# Patient Record
Sex: Female | Born: 1990 | Race: White | Hispanic: No | Marital: Married | Smoking: Current every day smoker
Health system: Southern US, Community
[De-identification: ages and names within clinical notes are randomized; demographics above are authoritative.]

## PROBLEM LIST (undated history)

## (undated) HISTORY — PX: TUBAL LIGATION: SHX77

---
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
## 2012-04-24 ENCOUNTER — Encounter (HOSPITAL_COMMUNITY): Payer: Self-pay | Admitting: Emergency Medicine

## 2012-04-24 ENCOUNTER — Emergency Department (HOSPITAL_COMMUNITY)
Admission: EM | Admit: 2012-04-24 | Discharge: 2012-04-24 | Disposition: A | Discharge status: Home / Self Care | Payer: Medicaid Other | Attending: Emergency Medicine | Admitting: Emergency Medicine

## 2012-04-24 DIAGNOSIS — Z3202 Encounter for pregnancy test, result negative: Secondary | ICD-10-CM | POA: Insufficient documentation

## 2012-04-24 DIAGNOSIS — R109 Unspecified abdominal pain: Secondary | ICD-10-CM | POA: Insufficient documentation

## 2012-04-24 DIAGNOSIS — R110 Nausea: Secondary | ICD-10-CM | POA: Insufficient documentation

## 2012-04-24 DIAGNOSIS — R102 Pelvic and perineal pain: Secondary | ICD-10-CM

## 2012-04-24 LAB — URINALYSIS, ROUTINE W REFLEX MICROSCOPIC
Bilirubin Urine: NEGATIVE
Ketones, ur: NEGATIVE mg/dL
Leukocytes, UA: NEGATIVE
Specific Gravity, Urine: 1.02 (ref 1.005–1.030)
Urobilinogen, UA: 0.2 mg/dL (ref 0.0–1.0)
pH: 7 (ref 5.0–8.0)

## 2012-04-24 LAB — WET PREP, GENITAL
Clue Cells Wet Prep HPF POC: NONE SEEN
Yeast Wet Prep HPF POC: NONE SEEN

## 2012-04-24 LAB — PREGNANCY, URINE: Preg Test, Ur: NEGATIVE

## 2012-04-24 MED ORDER — ONDANSETRON HCL 4 MG PO TABS: 4.0000 mg | ORAL_TABLET | Freq: Three times a day (TID) | ORAL | Status: DC | PRN

## 2012-04-24 MED ORDER — KETOROLAC TROMETHAMINE 60 MG/2ML IM SOLN
60.0000 mg | Freq: Once | INTRAMUSCULAR | Status: DC
  Filled 2012-04-24: qty 2

## 2012-04-24 MED ORDER — ACETAMINOPHEN 500 MG PO TABS
1000.0000 mg | ORAL_TABLET | Freq: Once | ORAL | Status: AC
  Administered 2012-04-24: 1000 mg via ORAL
  Filled 2012-04-24: qty 2

## 2012-04-24 MED ORDER — IBUPROFEN 400 MG PO TABS
400.0000 mg | ORAL_TABLET | Freq: Once | ORAL | Status: AC
  Administered 2012-04-24: 400 mg via ORAL
  Filled 2012-04-24: qty 1

## 2012-04-24 MED ORDER — NAPROXEN 250 MG PO TABS: 250.0000 mg | ORAL_TABLET | Freq: Two times a day (BID) | ORAL | Status: DC

## 2012-04-24 NOTE — ED Notes (Signed)
Patient states she started having lower abdominal pain about 2 weeks ago that is accompanied by nausea, states she is really late for her period as well.  States that she is sexually active and could be pregnant.

## 2012-04-24 NOTE — ED Notes (Signed)
Patient complaining of abdominal pain and nausea x 2 weeks. States she is "really late for my period and I'm afraid I may be pregnant."

## 2012-04-24 NOTE — ED Provider Notes (Signed)
History     CSN: [id number]  Arrival date & time 04/24/12  2009   First MD Initiated Contact with Patient 04/24/12 2016      Chief Complaint  Patient presents with  . Abdominal Pain    HPI Pt was seen at 2030.   Per pt, c/o gradual onset and persistence of constant lower pelvic "pain" for the past 2 weeks.  Has been associated with nausea.  States she took a home preg test which was negative but "I'm late for my period and scared I'm pregnant."  LMP 03/11/2012 and "just spotted a little" in the last week.  Denies back pain, no vomiting/diarrhea, no fevers, no dysuria, no vaginal discharge, no CP/SOB.       History reviewed. No pertinent past medical history.  Past Surgical History  Procedure Date  . Cesarean section     History  Substance Use Topics  . Smoking status: Never Smoker   . Smokeless tobacco: Not on file  . Alcohol Use: No      Review of Systems ROS: Statement: All systems negative except as marked or noted in the HPI; Constitutional: Negative for fever and chills. ; ; Eyes: Negative for eye pain, redness and discharge. ; ; ENMT: Negative for ear pain, hoarseness, nasal congestion, sinus pressure and sore throat. ; ; Cardiovascular: Negative for chest pain, palpitations, diaphoresis, dyspnea and peripheral edema. ; ; Respiratory: Negative for cough, wheezing and stridor. ; ; Gastrointestinal: +nausea, abd pain. Negative for vomiting, diarrhea, blood in stool, hematemesis, jaundice and rectal bleeding. . ; ; Genitourinary: Negative for dysuria, flank pain and hematuria. ; ; GYN:  +vaginal spotting, no vaginal discharge, no vulvar pain. ;;Musculoskeletal: Negative for back pain and neck pain. Negative for swelling and trauma.; ; Skin: Negative for pruritus, rash, abrasions, blisters, bruising and skin lesion.; ; Neuro: Negative for headache, lightheadedness and neck stiffness. Negative for weakness, altered level of consciousness , altered mental status, extremity  weakness, paresthesias, involuntary movement, seizure and syncope.      Allergies  Diphtheria-tetanus toxoids dt  Home Medications  No current outpatient prescriptions on file.  BP 129/76  Pulse 104  Temp 98.9 F (37.2 C) (Oral)  Resp 16  Ht 4\' 10"  (1.473 m)  Wt 185 lb (83.915 kg)  BMI 38.66 kg/m2  SpO2 99%  LMP 03/11/2012  Physical Exam 2035: Physical examination:  Nursing notes reviewed; Vital signs and O2 SAT reviewed;  Constitutional: Well developed, Well nourished, Well hydrated, In no acute distress; Head:  Normocephalic, atraumatic; Eyes: EOMI, PERRL, No scleral icterus; ENMT: Mouth and pharynx normal, Mucous membranes moist; Neck: Supple, Full range of motion, No lymphadenopathy; Cardiovascular: Regular rate and rhythm, No murmur, rub, or gallop; Respiratory: Breath sounds clear & equal bilaterally, No rales, rhonchi, wheezes.  Speaking full sentences with ease, Normal respiratory effort/excursion; Chest: Nontender, Movement normal; Abdomen: Soft, +mild RLQ, suprapubic, LLQ tenderness to palp. No rebound or guarding. Nondistended, Normal bowel sounds; Genitourinary: No CVA tenderness; Pelvic exam performed with permission of pt and female ED tech assist during exam.  External genitalia w/o lesions. Vaginal vault with thick white discharge.  Cervix w/o lesions, not friable, GC/chlam and wet prep obtained and sent to lab.  Bimanual exam w/o CMT, uterine or adnexal tenderness.;;Extremities: Pulses normal, No tenderness, No edema, No calf edema or asymmetry.; Neuro: AA&Ox3, Major CN grossly intact.  Speech clear. Gait steady.  Climbs on and off stretcher easily by herself without distress.  No gross focal motor or sensory deficits  in extremities.; Skin: Color normal, Warm, Dry.   ED Course  Procedures    MDM  MDM Reviewed: nursing note, previous chart and vitals Interpretation: labs     Results for orders placed during the hospital encounter of 04/24/12  URINALYSIS, ROUTINE  W REFLEX MICROSCOPIC      Component Value Range   Color, Urine YELLOW  YELLOW   APPearance CLEAR  CLEAR   Specific Gravity, Urine 1.020  1.005 - 1.030   pH 7.0  5.0 - 8.0   Glucose, UA NEGATIVE  NEGATIVE mg/dL   Hgb urine dipstick NEGATIVE  NEGATIVE   Bilirubin Urine NEGATIVE  NEGATIVE   Ketones, ur NEGATIVE  NEGATIVE mg/dL   Protein, ur NEGATIVE  NEGATIVE mg/dL   Urobilinogen, UA 0.2  0.0 - 1.0 mg/dL   Nitrite NEGATIVE  NEGATIVE   Leukocytes, UA NEGATIVE  NEGATIVE  PREGNANCY, URINE      Component Value Range   Preg Test, Ur NEGATIVE  NEGATIVE  WET PREP, GENITAL      Component Value Range   Yeast Wet Prep HPF POC NONE SEEN  NONE SEEN   Trich, Wet Prep NONE SEEN  NONE SEEN   Clue Cells Wet Prep HPF POC NONE SEEN  NONE SEEN   WBC, Wet Prep HPF POC MANY (*) NONE SEEN     2215:  No pelvic tenderness on exam.  GC/chlam pending.  Continues to appear NAD, resps easy, gait upright and steady.  Climbs on and off stretcher easily by herself without distress.  Wants to go home now.  Dx and testing d/w pt.  Questions answered.  Verb understanding, agreeable to d/c home with outpt f/u.          Laray Anger, DO 04/28/12 [phone]

## 2014-06-11 ENCOUNTER — Encounter (HOSPITAL_COMMUNITY): Payer: Self-pay | Admitting: Emergency Medicine

## 2014-06-11 ENCOUNTER — Emergency Department (HOSPITAL_COMMUNITY)
Admission: EM | Admit: 2014-06-11 | Discharge: 2014-06-11 | Disposition: A | Discharge status: Home / Self Care | Payer: Medicaid Other | Attending: Emergency Medicine | Admitting: Emergency Medicine

## 2014-06-11 DIAGNOSIS — K0889 Other specified disorders of teeth and supporting structures: Secondary | ICD-10-CM

## 2014-06-11 DIAGNOSIS — K029 Dental caries, unspecified: Secondary | ICD-10-CM | POA: Insufficient documentation

## 2014-06-11 DIAGNOSIS — K088 Other specified disorders of teeth and supporting structures: Secondary | ICD-10-CM | POA: Insufficient documentation

## 2014-06-11 DIAGNOSIS — Z791 Long term (current) use of non-steroidal anti-inflammatories (NSAID): Secondary | ICD-10-CM | POA: Insufficient documentation

## 2014-06-11 MED ORDER — TRAMADOL HCL 50 MG PO TABS: 50.0000 mg | ORAL_TABLET | Freq: Four times a day (QID) | ORAL | Status: DC | PRN

## 2014-06-11 MED ORDER — PENICILLIN V POTASSIUM 250 MG PO TABS: 250.0000 mg | ORAL_TABLET | Freq: Four times a day (QID) | ORAL | Status: AC

## 2014-06-11 NOTE — Discharge Instructions (Signed)
Follow up with the dentist as discussed °Dental Pain °A tooth ache may be caused by cavities (tooth decay). Cavities expose the nerve of the tooth to air and hot or cold temperatures. It may come from an infection or abscess (also called a boil or furuncle) around your tooth. It is also often caused by dental caries (tooth decay). This causes the pain you are having. °DIAGNOSIS  °Your caregiver can diagnose this problem by exam. °TREATMENT  °· If caused by an infection, it may be treated with medications which kill germs (antibiotics) and pain medications as prescribed by your caregiver. Take medications as directed. °· Only take over-the-counter or prescription medicines for pain, discomfort, or fever as directed by your caregiver. °· Whether the tooth ache today is caused by infection or dental disease, you should see your dentist as soon as possible for further care. °SEEK MEDICAL CARE IF: °The exam and treatment you received today has been provided on an emergency basis only. This is not a substitute for complete medical or dental care. If your problem worsens or new problems (symptoms) appear, and you are unable to meet with your dentist, call or return to this location. °SEEK IMMEDIATE MEDICAL CARE IF:  °· You have a fever. °· You develop redness and swelling of your face, jaw, or neck. °· You are unable to open your mouth. °· You have severe pain uncontrolled by pain medicine. °MAKE SURE YOU:  °· Understand these instructions. °· Will watch your condition. °· Will get help right away if you are not doing well or get worse. °Document Released: 05/16/2005 Document Revised: 08/08/2011 Document Reviewed: 01/02/2008 °[employer]® Patient Information ©2015 [employer]. This information is not intended to replace advice given to you by your health care provider. Make sure you discuss any questions you have with your health care provider. ° °

## 2014-06-11 NOTE — ED Provider Notes (Signed)
CSN: [id number]     Arrival date & time 06/11/14  1353 History   First MD Initiated Contact with Patient 06/11/14 1412     Chief Complaint  Patient presents with  . Dental Pain     (Consider location/radiation/quality/duration/timing/severity/associated sxs/prior Treatment) Patient is a 24 y.o. female presenting with tooth pain. The history is provided by the patient. No language interpreter was used.  Dental Pain Location:  Generalized Quality:  Aching Severity:  Moderate Onset quality:  Gradual Duration:  4 weeks Timing:  Constant Progression:  Unchanged Chronicity:  New Relieved by:  Nothing Ineffective treatments:  Acetaminophen and topical anesthetic gel Associated symptoms: no fever, no gum swelling, no neck pain, no oral lesions and no trismus     History reviewed. No pertinent past medical history. Past Surgical History  Procedure Laterality Date  . Cesarean section     History reviewed. No pertinent family history. History  Substance Use Topics  . Smoking status: Never Smoker   . Smokeless tobacco: Not on file  . Alcohol Use: No   OB History    No data available     Review of Systems  Constitutional: Negative for fever.  HENT: Negative for mouth sores.   Musculoskeletal: Negative for neck pain.  All other systems reviewed and are negative.     Allergies  Diphtheria-tetanus toxoids dt and Latex  Home Medications   Prior to Admission medications   Medication Sig Start Date End Date Taking? Authorizing Provider  naproxen (NAPROSYN) 250 MG tablet Take 1 tablet (250 mg total) by mouth 2 (two) times daily with a meal. 04/24/12   Samuel JesterKathleen McManus, DO  ondansetron (ZOFRAN) 4 MG tablet Take 1 tablet (4 mg total) by mouth every 8 (eight) hours as needed for nausea. 04/24/12   Samuel JesterKathleen McManus, DO   BP 121/68 mmHg  Pulse 87  Temp(Src) 98.5 F (36.9 C) (Oral)  Resp 20  Ht 4\' 9"  (1.448 m)  Wt 160 lb (72.576 kg)  BMI 34.61 kg/m2  SpO2 100% Physical Exam   Constitutional: She appears well-developed and well-nourished.  HENT:  Right Ear: External ear normal.  Left Ear: External ear normal.  Pt has decay and and cracks to multiple teeth. No swelling noted  Cardiovascular: Normal rate and regular rhythm.   Pulmonary/Chest: Effort normal and breath sounds normal.  Nursing note and vitals reviewed.   ED Course  Procedures (including critical care time) Labs Review Labs Reviewed - No data to display  Imaging Review No results found.   EKG Interpretation None      MDM   Final diagnoses:  Pain, dental    Pt treated with pcn and ultram. No airway involvement    Teressa LowerVrinda Ezmeralda Stefanick, NP 06/11/14 1441  Joya Gaskinsonald W Wickline, MD 06/11/14 1549

## 2014-06-11 NOTE — ED Notes (Signed)
Pt states she has been having dental pain to both sides of mouth (upper and lower on L. Side and lower on R side) for about 4 weeks. States she cannot afford to see a Education officer, communitydentist.

## 2018-02-10 ENCOUNTER — Encounter (HOSPITAL_COMMUNITY): Payer: Self-pay | Admitting: Emergency Medicine

## 2018-02-10 ENCOUNTER — Other Ambulatory Visit: Payer: Self-pay

## 2018-02-10 ENCOUNTER — Inpatient Hospital Stay (HOSPITAL_COMMUNITY)
Admission: EM | Admit: 2018-02-10 | Discharge: 2018-02-14 | DRG: 419 | Disposition: A | Discharge status: Home / Self Care | Payer: Medicaid Other | Attending: Orthopedic Surgery | Admitting: Orthopedic Surgery

## 2018-02-10 ENCOUNTER — Emergency Department (HOSPITAL_COMMUNITY): Payer: Medicaid Other

## 2018-02-10 DIAGNOSIS — K8050 Calculus of bile duct without cholangitis or cholecystitis without obstruction: Secondary | ICD-10-CM

## 2018-02-10 DIAGNOSIS — Z6832 Body mass index (BMI) 32.0-32.9, adult: Secondary | ICD-10-CM

## 2018-02-10 DIAGNOSIS — E669 Obesity, unspecified: Secondary | ICD-10-CM | POA: Diagnosis present

## 2018-02-10 DIAGNOSIS — K8066 Calculus of gallbladder and bile duct with acute and chronic cholecystitis without obstruction: Principal | ICD-10-CM | POA: Diagnosis present

## 2018-02-10 DIAGNOSIS — F17210 Nicotine dependence, cigarettes, uncomplicated: Secondary | ICD-10-CM | POA: Diagnosis present

## 2018-02-10 DIAGNOSIS — K819 Cholecystitis, unspecified: Secondary | ICD-10-CM

## 2018-02-10 DIAGNOSIS — K810 Acute cholecystitis: Secondary | ICD-10-CM | POA: Diagnosis not present

## 2018-02-10 DIAGNOSIS — K2990 Gastroduodenitis, unspecified, without bleeding: Secondary | ICD-10-CM | POA: Diagnosis present

## 2018-02-10 LAB — URINALYSIS, ROUTINE W REFLEX MICROSCOPIC
Bilirubin Urine: NEGATIVE
Glucose, UA: NEGATIVE mg/dL
Ketones, ur: NEGATIVE mg/dL
Leukocytes, UA: NEGATIVE
Nitrite: NEGATIVE
Protein, ur: 30 mg/dL — AB
Specific Gravity, Urine: 1.039 — ABNORMAL HIGH (ref 1.005–1.030)
pH: 5 (ref 5.0–8.0)

## 2018-02-10 LAB — COMPREHENSIVE METABOLIC PANEL
ALT: 14 U/L (ref 0–44)
AST: 21 U/L (ref 15–41)
Albumin: 4.3 g/dL (ref 3.5–5.0)
Alkaline Phosphatase: 64 U/L (ref 38–126)
Anion gap: 8 (ref 5–15)
BUN: 12 mg/dL (ref 6–20)
CO2: 25 mmol/L (ref 22–32)
Calcium: 9.1 mg/dL (ref 8.9–10.3)
Chloride: 110 mmol/L (ref 98–111)
Creatinine, Ser: 0.79 mg/dL (ref 0.44–1.00)
GFR calc Af Amer: >60 mL/min (ref >60)
GFR calc non Af Amer: >60 mL/min (ref >60)
Glucose, Bld: 116 mg/dL — ABNORMAL HIGH (ref 70–99)
Potassium: 4.1 mmol/L (ref 3.5–5.1)
Sodium: 143 mmol/L (ref 135–145)
Total Bilirubin: 0.5 mg/dL (ref 0.3–1.2)
Total Protein: 7.9 g/dL (ref 6.5–8.1)

## 2018-02-10 LAB — CBC
HCT: 40.7 % (ref 36.0–46.0)
Hemoglobin: 12.8 g/dL (ref 12.0–15.0)
MCH: 28.4 pg (ref 26.0–34.0)
MCHC: 31.4 g/dL (ref 30.0–36.0)
MCV: 90.2 fL (ref 78.0–100.0)
Platelets: 223 10*3/uL (ref 150–400)
RBC: 4.51 MIL/uL (ref 3.87–5.11)
RDW: 13.8 % (ref 11.5–15.5)
WBC: 6.7 10*3/uL (ref 4.0–10.5)

## 2018-02-10 LAB — I-STAT BETA HCG BLOOD, ED (MC, WL, AP ONLY): I-stat hCG, quantitative: <5 m[IU]/mL (ref <5)

## 2018-02-10 LAB — LIPASE, BLOOD: Lipase: 27 U/L (ref 11–51)

## 2018-02-10 MED ORDER — ONDANSETRON HCL 4 MG PO TABS: 4.0000 mg | ORAL_TABLET | Freq: Four times a day (QID) | ORAL | Status: DC | PRN

## 2018-02-10 MED ORDER — ACETAMINOPHEN 650 MG RE SUPP: 650.0000 mg | Freq: Four times a day (QID) | RECTAL | Status: DC | PRN

## 2018-02-10 MED ORDER — ONDANSETRON HCL 4 MG/2ML IJ SOLN
4.0000 mg | Freq: Four times a day (QID) | INTRAMUSCULAR | Status: DC | PRN
  Administered 2018-02-11 – 2018-02-12 (×3): 4 mg via INTRAVENOUS
  Filled 2018-02-10 (×3): qty 2

## 2018-02-10 MED ORDER — IOPAMIDOL (ISOVUE-300) INJECTION 61%
100.0000 mL | Freq: Once | INTRAVENOUS | Status: AC | PRN
  Administered 2018-02-10: 100 mL via INTRAVENOUS

## 2018-02-10 MED ORDER — GI COCKTAIL ~~LOC~~
30.0000 mL | Freq: Once | ORAL | Status: AC
  Administered 2018-02-10: 30 mL via ORAL
  Filled 2018-02-10: qty 30

## 2018-02-10 MED ORDER — SENNOSIDES-DOCUSATE SODIUM 8.6-50 MG PO TABS: 1.0000 | ORAL_TABLET | Freq: Every evening | ORAL | Status: DC | PRN

## 2018-02-10 MED ORDER — NICOTINE 21 MG/24HR TD PT24
21.0000 mg | MEDICATED_PATCH | Freq: Every day | TRANSDERMAL | Status: DC
  Administered 2018-02-10 – 2018-02-14 (×4): 21 mg via TRANSDERMAL
  Filled 2018-02-10 (×5): qty 1

## 2018-02-10 MED ORDER — HYDROMORPHONE HCL 1 MG/ML IJ SOLN
1.0000 mg | INTRAMUSCULAR | Status: DC | PRN
  Administered 2018-02-10 – 2018-02-11 (×7): 1 mg via INTRAVENOUS
  Filled 2018-02-10 (×7): qty 1

## 2018-02-10 MED ORDER — SODIUM CHLORIDE 0.9 % IV BOLUS
1000.0000 mL | Freq: Once | INTRAVENOUS | Status: AC
  Administered 2018-02-10: 1000 mL via INTRAVENOUS

## 2018-02-10 MED ORDER — ZOLPIDEM TARTRATE 5 MG PO TABS
5.0000 mg | ORAL_TABLET | Freq: Every evening | ORAL | Status: DC | PRN
  Administered 2018-02-12 (×2): 5 mg via ORAL
  Filled 2018-02-10 (×2): qty 1

## 2018-02-10 MED ORDER — POTASSIUM CHLORIDE IN NACL 20-0.9 MEQ/L-% IV SOLN
INTRAVENOUS | Status: DC
  Administered 2018-02-10 – 2018-02-14 (×8): via INTRAVENOUS

## 2018-02-10 MED ORDER — MORPHINE SULFATE (PF) 4 MG/ML IV SOLN
6.0000 mg | Freq: Once | INTRAVENOUS | Status: AC
  Administered 2018-02-10: 6 mg via INTRAVENOUS
  Filled 2018-02-10: qty 2

## 2018-02-10 MED ORDER — ACETAMINOPHEN 325 MG PO TABS
650.0000 mg | ORAL_TABLET | Freq: Four times a day (QID) | ORAL | Status: DC | PRN
  Administered 2018-02-11: 650 mg via ORAL
  Filled 2018-02-10: qty 2

## 2018-02-10 MED ORDER — ONDANSETRON HCL 4 MG/2ML IJ SOLN
4.0000 mg | Freq: Once | INTRAMUSCULAR | Status: AC
  Administered 2018-02-10: 4 mg via INTRAVENOUS
  Filled 2018-02-10: qty 2

## 2018-02-10 MED ORDER — SODIUM CHLORIDE 0.9 % IV SOLN
2.0000 g | INTRAVENOUS | Status: DC
  Administered 2018-02-10 – 2018-02-13 (×4): 2 g via INTRAVENOUS
  Filled 2018-02-10 (×3): qty 20
  Filled 2018-02-10: qty 2
  Filled 2018-02-10 (×4): qty 20

## 2018-02-10 MED ORDER — FAMOTIDINE IN NACL 20-0.9 MG/50ML-% IV SOLN
20.0000 mg | Freq: Once | INTRAVENOUS | Status: AC
  Administered 2018-02-10: 20 mg via INTRAVENOUS
  Filled 2018-02-10: qty 50

## 2018-02-10 MED ORDER — HYDROMORPHONE HCL 1 MG/ML IJ SOLN
1.0000 mg | Freq: Once | INTRAMUSCULAR | Status: AC
  Administered 2018-02-10: 1 mg via INTRAVENOUS
  Filled 2018-02-10: qty 1

## 2018-02-10 NOTE — Progress Notes (Signed)
Called to room by Lonn Georgiaachel Tate RN she she states that patient admits chest discomfort but denies chest pain. Vitals taken and are as follows: BH 110/52 HR53 O2 SAT 99 on RA. Patient appears asymptomatic for cardiac distress. Mid level paged with above finding. RN's will continue to monitor PT as shift continues. Before conclusion of this note midlevel did respond with order to do an EXG

## 2018-02-10 NOTE — ED Triage Notes (Signed)
Patient c/o upper abd pain that started x1 week ago and is progressively getting worse. Per patient nausea and vomiting. Denies any diarrhea or urinary symptoms. Patient does state pain radiates into back.

## 2018-02-10 NOTE — H&P (Signed)
History and Physical  Michaela [name] NFA:[id number]:[id number] DOB: [birth year] DOA: 02/10/2018  Referring physician: Dr Juleen ChinaKohut, ED physician PCP: Patient, No Pcp Per  Outpatient Specialists:   Patient Coming From: home  Chief Complaint: abdominal pain  HPI: Michaela [name] is a 27 y.o. female who presents with 5 days of progressively worse abdominal pain in epigastric region with radiation into back. Pain initially intermittent, but now more constant x24 hours. Tylenol and NSAIDs have not been helpful. No other palliating or provoking factors.   Emergency Department Course: CT scan shows possible cholecystitis. WBC normal. EDP discussed with Dr Lovell SheehanJenkins (Gen Surg), who recommended antibiotics and admission - he will see her tomorrow as she is not acutely in need of surgery.   Review of Systems:  Pt denies any fevers, chills, nausea, vomiting, diarrhea, constipation, shortness of breath, dyspnea on exertion, orthopnea, cough, wheezing, palpitations, headache, vision changes, lightheadedness, dizziness, melena, rectal bleeding.  Review of systems are otherwise negative  History reviewed. No pertinent past medical history. Past Surgical History:  Procedure Laterality Date  . CESAREAN SECTION    . TUBAL LIGATION     Social History:  reports that she has been smoking cigarettes. She has a 2.00 pack-year smoking history. She has never used smokeless tobacco. She reports that she does not drink alcohol or use drugs. Patient lives at home  Allergies  Allergen Reactions  . Pertussis Vaccines Hives    Headache   . Latex Itching and Rash    No family history on file.    Prior to Admission medications   Not on File    Physical Exam: BP 117/79   Pulse 99   Temp 97.7 F (36.5 C) (Oral)   Resp 15   Ht 4\' 9"  (1.448 m)   Wt 68 kg   SpO2 99%   BMI 32.46 kg/m   . General: Young caucasian female. Awake and alert and oriented x3. No acute cardiopulmonary distress.  Marland Kitchen. HEENT: Normocephalic  atraumatic.  Right and left ears normal in appearance.  Pupils equal, round, reactive to light. Extraocular muscles are intact. Sclerae anicteric and noninjected.  Moist mucosal membranes. No mucosal lesions.  . Neck: Neck supple without lymphadenopathy. No carotid bruits. No masses palpated.  . Cardiovascular: Regular rate with normal S1-S2 sounds. No murmurs, rubs, gallops auscultated. No JVD.  Marland Kitchen. Respiratory: Good respiratory effort with no wheezes, rales, rhonchi. Lungs clear to auscultation bilaterally.  No accessory muscle use. . Abdomen: Soft, tender in the epigastric and RUQ area, no rebound tenderness, but significant guarding. Nondistended. Active bowel sounds. No masses or hepatosplenomegaly  . Skin: No rashes, lesions, or ulcerations.  Dry, warm to touch. 2+ dorsalis pedis and radial pulses. . Musculoskeletal: No calf or leg pain. All major joints not erythematous nontender.  No upper or lower joint deformation.  Good ROM.  No contractures  . Psychiatric: Intact judgment and insight. Pleasant and cooperative. . Neurologic: No focal neurological deficits. Strength is 5/5 and symmetric in upper and lower extremities.  Cranial nerves II through XII are grossly intact.           Labs on Admission: I have personally reviewed following labs and imaging studies  CBC: Recent Labs  Lab 02/10/18 1204  WBC 6.7  HGB 12.8  HCT 40.7  MCV 90.2  PLT 223   Basic Metabolic Panel: Recent Labs  Lab 02/10/18 1204  NA 143  K 4.1  CL 110  CO2 25  GLUCOSE 116*  BUN 12  CREATININE 0.79  CALCIUM 9.1   GFR: Estimated Creatinine Clearance: 85.5 mL/min (by C-G formula based on SCr of 0.79 mg/dL). Liver Function Tests: Recent Labs  Lab 02/10/18 1204  AST 21  ALT 14  ALKPHOS 64  BILITOT 0.5  PROT 7.9  ALBUMIN 4.3   Recent Labs  Lab 02/10/18 1204  LIPASE 27   No results for input(s): AMMONIA in the last 168 hours. Coagulation Profile: No results for input(s): INR, PROTIME in the  last 168 hours. Cardiac Enzymes: No results for input(s): CKTOTAL, CKMB, CKMBINDEX, TROPONINI in the last 168 hours. BNP (last 3 results) No results for input(s): PROBNP in the last 8760 hours. HbA1C: No results for input(s): HGBA1C in the last 72 hours. CBG: No results for input(s): GLUCAP in the last 168 hours. Lipid Profile: No results for input(s): CHOL, HDL, LDLCALC, TRIG, CHOLHDL, LDLDIRECT in the last 72 hours. Thyroid Function Tests: No results for input(s): TSH, T4TOTAL, FREET4, T3FREE, THYROIDAB in the last 72 hours. Anemia Panel: No results for input(s): VITAMINB12, FOLATE, FERRITIN, TIBC, IRON, RETICCTPCT in the last 72 hours. Urine analysis:    Component Value Date/Time   COLORURINE YELLOW 02/10/2018 1204   APPEARANCEUR HAZY (A) 02/10/2018 1204   LABSPEC 1.039 (H) 02/10/2018 1204   PHURINE 5.0 02/10/2018 1204   GLUCOSEU NEGATIVE 02/10/2018 1204   HGBUR MODERATE (A) 02/10/2018 1204   BILIRUBINUR NEGATIVE 02/10/2018 1204   KETONESUR NEGATIVE 02/10/2018 1204   PROTEINUR 30 (A) 02/10/2018 1204   UROBILINOGEN 0.2 04/24/2012 2027   NITRITE NEGATIVE 02/10/2018 1204   LEUKOCYTESUR NEGATIVE 02/10/2018 1204   Sepsis Labs: @LABRCNTIP (procalcitonin:4,lacticidven:4) )No results found for this or any previous visit (from the past 240 hour(s)).   Radiological Exams on Admission: Ct Abdomen Pelvis W Contrast  Result Date: 02/10/2018 CLINICAL DATA:  Acute upper abdominal pain EXAM: CT ABDOMEN AND PELVIS WITH CONTRAST TECHNIQUE: Multidetector CT imaging of the abdomen and pelvis was performed using the standard protocol following bolus administration of intravenous contrast. CONTRAST:  ISOVUE-300 IOPAMIDOL (ISOVUE-300) INJECTION 61% COMPARISON:  None. FINDINGS: Lower chest: No acute abnormality. Hepatobiliary: Gallbladder is well distended. Some mild suggestion of gallbladder wall thickening and pericholecystic fluid is noted. The liver demonstrates some periportal hypodensity  which may be related to the timing of the contrast bolus although the possibility of an underlying hepatic inflammatory change could not be totally excluded. Pancreas: Unremarkable. No pancreatic ductal dilatation or surrounding inflammatory changes. Spleen: Patchy enhancement changes are noted within the spleen likely related to the timing of the contrast bolus. Adrenals/Urinary Tract: Adrenal glands are within normal limits. The kidneys are well visualized bilaterally. No renal calculi or obstructive changes are noted. Bladder is decompressed. Stomach/Bowel: Colon is within normal limits. No small bowel obstructive changes are seen. The appendix is within normal limits. Vascular/Lymphatic: No significant vascular findings are present. No enlarged abdominal or pelvic lymph nodes. Reproductive: Uterus and bilateral adnexa are unremarkable. Changes of prior tubal ligation are noted. Other: No abdominal wall hernia or abnormality. No abdominopelvic ascites. Musculoskeletal: No acute or significant osseous findings. IMPRESSION: Periportal edematous changes which may be related to the timing of the contrast bolus as no significant hepatic venous opacification is noted although the possibility of an underlying hepatic inflammatory change could not be totally excluded. Correlate with hepatic lab values Mild inflammatory changes surrounding the gallbladder. The possibility of cholecystitis deserves consideration but incompletely evaluated on this CT. Right upper quadrant ultrasound may be helpful. Electronically Signed   By: Eulah Pont.D.  On: 02/10/2018 14:40    Assessment/Plan: Active Problems:   Cholecystitis, acute    This patient was discussed with the ED physician, including pertinent vitals, physical exam findings, labs, and imaging.  We also discussed care given by the ED provider.  1. Cholecystitis a. Observation b. Pain control c. Ceftriaxone d. [place] tomorrow AM e. Gen Surg to see f. Repeat  CBC, BMP g. IVF h. Clear liquid diet  DVT prophylaxis: SCD Consultants: Gen Surg Code Status: Full Family Communication: Mother present during interview and exam  Disposition Plan: patient to return home following improvement of abdominal pain   Levie Heritage, DO [facility] Pager [phone]  If 7PM-7AM, please contact night-coverage [email or web address] Password [email or web address]

## 2018-02-10 NOTE — ED Provider Notes (Signed)
[facility] EMERGENCY DEPARTMENT Provider Note   CSN: [id number] Arrival date & time: 02/10/18  1154     History   Chief Complaint Chief Complaint  Patient presents with  . Abdominal Pain    HPI Michaela [name] is a 27 y.o. female.  HPI   27 year old female with abdominal pain.  Onset about 5 days ago and progressively worsening.  Initially the pain was intermittent, but is been pretty constant for the last 24 hours or so.  The pain is in epigastric to periumbilical region with radiation into the back.  No clear postprandial changes.  Associate with nausea and has vomited on occasion.  No diarrhea.  No urinary symptoms.  She is tried taking Tylenol and NSAIDs without improvement.  She does not use NSAIDs on a regular basis otherwise.  Reports only occasional alcohol use.  No fevers or chills.  Surgical history significant for cesarean section and tubal ligation.  History reviewed. No pertinent past medical history.  There are no active problems to display for this patient.   Past Surgical History:  Procedure Laterality Date  . CESAREAN SECTION    . TUBAL LIGATION       OB History    Gravida  2   Para  2   Term  2   Preterm      AB      Living  2     SAB      TAB      Ectopic      Multiple      Live Births               Home Medications    Prior to Admission medications   Medication Sig Start Date End Date Taking? Authorizing Provider  acetaminophen (TYLENOL) 500 MG tablet Take 1,000 mg by mouth every 8 (eight) hours as needed (pain).    [provider]  ibuprofen (ADVIL,MOTRIN) 200 MG tablet Take 600 mg by mouth every 8 (eight) hours as needed (pain).    [provider]  naproxen (NAPROSYN) 250 MG tablet Take 1 tablet (250 mg total) by mouth 2 (two) times daily with a meal. Patient not taking: Reported on 06/11/2014 04/24/12   Samuel Jester, DO  ondansetron (ZOFRAN) 4 MG tablet Take 1 tablet (4 mg total) by mouth every 8  (eight) hours as needed for nausea. Patient not taking: Reported on 06/11/2014 04/24/12   Samuel Jester, DO  traMADol (ULTRAM) 50 MG tablet Take 1 tablet (50 mg total) by mouth every 6 (six) hours as needed. 06/11/14   Teressa Lower, NP    Family History No family history on file.  Social History Social History   Tobacco Use  . Smoking status: Current Every Day Smoker    Packs/day: 0.50    Years: 4.00    Pack years: 2.00    Types: Cigarettes  . Smokeless tobacco: Never Used  Substance Use Topics  . Alcohol use: No  . Drug use: No     Allergies   Pertussis vaccines and Latex   Review of Systems Review of Systems  All systems reviewed and negative, other than as noted in HPI.  Physical Exam Updated Vital Signs BP (!) 129/111 (BP Location: Right Arm)   Pulse (!) 55   Temp 97.7 F (36.5 C) (Oral)   Resp 15   Ht 4\' 9"  (1.448 m)   Wt 68 kg   SpO2 100%   BMI 32.46 kg/m   Physical Exam  Constitutional: She appears well-developed and well-nourished. No distress.  And on edge of bed.  Appears uncomfortable, but not toxic.  HENT:  Head: Normocephalic and atraumatic.  Eyes: Conjunctivae are normal. Right eye exhibits no discharge. Left eye exhibits no discharge.  Neck: Neck supple.  Cardiovascular: Normal rate, regular rhythm and normal heart sounds. Exam reveals no gallop and no friction rub.  No murmur heard. Pulmonary/Chest: Effort normal and breath sounds normal. No respiratory distress.  Abdominal: Soft. She exhibits no distension and no mass. There is tenderness. There is no guarding.  Tenderness across upper abdomen, focally worse in epigastrium. No rebound or guarding.   Musculoskeletal: She exhibits no edema or tenderness.  Neurological: She is alert.  Skin: Skin is warm and dry.  Psychiatric: She has a normal mood and affect. Her behavior is normal. Thought content normal.  Nursing note and vitals reviewed.    ED Treatments / Results  Labs (all  labs ordered are listed, but only abnormal results are displayed) Labs Reviewed  COMPREHENSIVE METABOLIC PANEL - Abnormal; Notable for the following components:      Result Value   Glucose, Bld 116 (*)    All other components within normal limits  URINALYSIS, ROUTINE W REFLEX MICROSCOPIC - Abnormal; Notable for the following components:   APPearance HAZY (*)    Specific Gravity, Urine 1.039 (*)    Hgb urine dipstick MODERATE (*)    Protein, ur 30 (*)    Bacteria, UA RARE (*)    All other components within normal limits  LIPASE, BLOOD  CBC  I-STAT BETA HCG BLOOD, ED ([facility], [facility], [facility] ONLY)    EKG None  Radiology Ct Abdomen Pelvis W Contrast  Result Date: 02/10/2018 CLINICAL DATA:  Acute upper abdominal pain EXAM: CT ABDOMEN AND PELVIS WITH CONTRAST TECHNIQUE: Multidetector CT imaging of the abdomen and pelvis was performed using the standard protocol following bolus administration of intravenous contrast. CONTRAST:  100mL ISOVUE-300 IOPAMIDOL (ISOVUE-300) INJECTION 61% COMPARISON:  None. FINDINGS: Lower chest: No acute abnormality. Hepatobiliary: Gallbladder is well distended. Some mild suggestion of gallbladder wall thickening and pericholecystic fluid is noted. The liver demonstrates some periportal hypodensity which may be related to the timing of the contrast bolus although the possibility of an underlying hepatic inflammatory change could not be totally excluded. Pancreas: Unremarkable. No pancreatic ductal dilatation or surrounding inflammatory changes. Spleen: Patchy enhancement changes are noted within the spleen likely related to the timing of the contrast bolus. Adrenals/Urinary Tract: Adrenal glands are within normal limits. The kidneys are well visualized bilaterally. No renal calculi or obstructive changes are noted. Bladder is decompressed. Stomach/Bowel: Colon is within normal limits. No small bowel obstructive changes are seen. The appendix is within normal limits.  Vascular/Lymphatic: No significant vascular findings are present. No enlarged abdominal or pelvic lymph nodes. Reproductive: Uterus and bilateral adnexa are unremarkable. Changes of prior tubal ligation are noted. Other: No abdominal wall hernia or abnormality. No abdominopelvic ascites. Musculoskeletal: No acute or significant osseous findings. IMPRESSION: Periportal edematous changes which may be related to the timing of the contrast bolus as no significant hepatic venous opacification is noted although the possibility of an underlying hepatic inflammatory change could not be totally excluded. Correlate with hepatic lab values Mild inflammatory changes surrounding the gallbladder. The possibility of cholecystitis deserves consideration but incompletely evaluated on this CT. Right upper quadrant ultrasound may be helpful. Electronically Signed   By: Alcide CleverMark  Lukens M.D.   On: 02/10/2018 14:40    Procedures Procedures (including  critical care time)  Medications Ordered in ED Medications  gi cocktail (Maalox,Lidocaine,Donnatal) (has no administration in time range)  famotidine (PEPCID) IVPB 20 mg premix (has no administration in time range)  HYDROmorphone (DILAUDID) injection 1 mg (has no administration in time range)  ondansetron (ZOFRAN) injection 4 mg (has no administration in time range)  sodium chloride 0.9 % bolus 1,000 mL (has no administration in time range)     Initial Impression / Assessment and Plan / ED Course  I have reviewed the triage vital signs and the nursing notes.  Pertinent labs & imaging results that were available during my care of the patient were reviewed by me and considered in my medical decision making (see chart for details).     27 year old female with upper abdominal pain.  My initial impression is that this is likely symptomatic cholelithiasis.  She seems fairly uncomfortable.  She is tender, but does not have peritonitis.  Will obtain labs including LFTs and  lipase.  Will obtain a right upper quadrant ultrasound.  Symptomatic treatment in the meantime.  Unable to obtain US for this indication on weekend. CT ordered and showing what is probably cholecystitis. She is afebrile. No leukocytosis. LFTs/lipase normal. She is still having significant pain though.  Discussed with Dr. Lovell Sheehan, general surgery.  Requesting hospitalist admission.  He is fine with a clear liquid diet.  He will see her in the morning.   Final Clinical Impressions(s) / ED Diagnoses   Final diagnoses:  Cholecystitis    ED Discharge Orders    None       Raeford Razor, MD 02/10/18 1524

## 2018-02-11 ENCOUNTER — Observation Stay (HOSPITAL_COMMUNITY): Payer: Medicaid Other

## 2018-02-11 DIAGNOSIS — K810 Acute cholecystitis: Secondary | ICD-10-CM | POA: Diagnosis not present

## 2018-02-11 LAB — BASIC METABOLIC PANEL
ANION GAP: 4 — AB (ref 5–15)
BUN: 5 mg/dL — ABNORMAL LOW (ref 6–20)
CHLORIDE: 110 mmol/L (ref 98–111)
CO2: 25 mmol/L (ref 22–32)
Calcium: 8.4 mg/dL — ABNORMAL LOW (ref 8.9–10.3)
Creatinine, Ser: 0.66 mg/dL (ref 0.44–1.00)
GFR calc non Af Amer: >60 mL/min (ref >60)
GLUCOSE: 110 mg/dL — AB (ref 70–99)
Potassium: 3.9 mmol/L (ref 3.5–5.1)
Sodium: 139 mmol/L (ref 135–145)

## 2018-02-11 LAB — CBC
HCT: 34.7 % — ABNORMAL LOW (ref 36.0–46.0)
HEMOGLOBIN: 10.8 g/dL — AB (ref 12.0–15.0)
MCH: 28.4 pg (ref 26.0–34.0)
MCHC: 31.1 g/dL (ref 30.0–36.0)
MCV: 91.3 fL (ref 78.0–100.0)
Platelets: 174 10*3/uL (ref 150–400)
RBC: 3.8 MIL/uL — AB (ref 3.87–5.11)
RDW: 14 % (ref 11.5–15.5)
WBC: 5.2 10*3/uL (ref 4.0–10.5)

## 2018-02-11 MED ORDER — ENOXAPARIN SODIUM 40 MG/0.4ML ~~LOC~~ SOLN
40.0000 mg | SUBCUTANEOUS | Status: DC
  Administered 2018-02-13: 40 mg via SUBCUTANEOUS
  Filled 2018-02-11 (×2): qty 0.4

## 2018-02-11 MED ORDER — HYDROMORPHONE HCL 1 MG/ML IJ SOLN
1.0000 mg | INTRAMUSCULAR | Status: DC | PRN
  Administered 2018-02-12 (×4): 1 mg via INTRAVENOUS
  Filled 2018-02-11 (×4): qty 1

## 2018-02-11 MED ORDER — HYDROMORPHONE HCL 1 MG/ML IJ SOLN
1.0000 mg | Freq: Once | INTRAMUSCULAR | Status: AC
  Administered 2018-02-11: 1 mg via INTRAVENOUS
  Filled 2018-02-11: qty 1

## 2018-02-11 MED ORDER — CHLORHEXIDINE GLUCONATE CLOTH 2 % EX PADS
6.0000 | MEDICATED_PAD | Freq: Once | CUTANEOUS | Status: AC
  Administered 2018-02-12: 6 via TOPICAL

## 2018-02-11 MED ORDER — CHLORHEXIDINE GLUCONATE CLOTH 2 % EX PADS
6.0000 | MEDICATED_PAD | Freq: Once | CUTANEOUS | Status: AC
  Administered 2018-02-11: 6 via TOPICAL

## 2018-02-11 NOTE — Consult Note (Signed)
Reason for Consult: Right upper quadrant abdominal pain Referring Physician: Dr. Edmonia Lynch is an 27 y.o. [name].  HPI: Patient is a Michaela [name] who presented to the emergency room with a 5-day history of worsening right upper quadrant abdominal pain.  Patient did have nausea but no vomiting.  She was found on CT scan of the abdomen to have pericholecystic edema.  Ultrasound of the right upper quadrant is pending.  Patient continues to have intermittent abdominal pain with an 8 out of 10.  No fever, chills, or jaundice.  History reviewed. No pertinent past medical history.  Past Surgical History:  Procedure Laterality Date  . CESAREAN SECTION    . TUBAL LIGATION      History reviewed. No pertinent family history.  Social History:  reports that she has been smoking cigarettes. She has a 2.00 pack-year smoking history. She has never used smokeless tobacco. She reports that she does not drink alcohol or use drugs.  Allergies:  Allergies  Allergen Reactions  . Pertussis Vaccines Hives    Headache   . Latex Itching and Rash    Medications: I have reviewed the patient's current medications.  Results for orders placed or performed during the hospital encounter of 02/10/18 (from the past 48 hour(s))  Lipase, blood     Status: None   Collection Time: 02/10/18 12:04 PM  Result Value Ref Range   Lipase 27 11 - 51 U/L    Comment: Performed at [facility], [address], [place], [place] [address]  Comprehensive metabolic panel     Status: Abnormal   Collection Time: 02/10/18 12:04 PM  Result Value Ref Range   Sodium 143 135 - 145 mmol/L   Potassium 4.1 3.5 - 5.1 mmol/L   Chloride 110 98 - 111 mmol/L   CO2 25 22 - 32 mmol/L   Glucose, Bld 116 (H) 70 - 99 mg/dL   BUN 12 6 - 20 mg/dL   Creatinine, Ser 0.79 0.44 - 1.00 mg/dL   Calcium 9.1 8.9 - 10.3 mg/dL   Total Protein 7.9 6.5 - 8.1 g/dL   Albumin 4.3 3.5 - 5.0 g/dL   AST 21 15 - 41 U/L   ALT 14 0 - 44  U/L   Alkaline Phosphatase 64 38 - 126 U/L   Total Bilirubin 0.5 0.3 - 1.2 mg/dL   GFR calc non Af Amer >60 >60 mL/min   GFR calc Af Amer >60 >60 mL/min    Comment: (NOTE) The eGFR has been calculated using the CKD EPI equation. This calculation has not been validated in all clinical situations. eGFR's persistently <60 mL/min signify possible Chronic Kidney Disease.    Anion gap 8 5 - 15    Comment: Performed at [facility], [address] [place], [place] [address]  CBC     Status: None   Collection Time: 02/10/18 12:04 PM  Result Value Ref Range   WBC 6.7 4.0 - 10.5 K/uL   RBC 4.51 3.87 - 5.11 MIL/uL   Hemoglobin 12.8 12.0 - 15.0 g/dL   HCT 40.7 36.0 - 46.0 %   MCV 90.2 78.0 - 100.0 fL   MCH 28.4 26.0 - 34.0 pg   MCHC 31.4 30.0 - 36.0 g/dL   RDW 13.8 11.5 - 15.5 %   Platelets 223 150 - 400 K/uL    Comment: Performed at [facility], [address], [place], [place] [address]  Urinalysis, Routine w reflex microscopic  Status: Abnormal   Collection Time: 02/10/18 12:04 PM  Result Value Ref Range   Color, Urine YELLOW YELLOW   APPearance HAZY (A) CLEAR   Specific Gravity, Urine 1.039 (H) 1.005 - 1.030   pH 5.0 5.0 - 8.0   Glucose, UA NEGATIVE NEGATIVE mg/dL   Hgb urine dipstick MODERATE (A) NEGATIVE   Bilirubin Urine NEGATIVE NEGATIVE   Ketones, ur NEGATIVE NEGATIVE mg/dL   Protein, ur 30 (A) NEGATIVE mg/dL   Nitrite NEGATIVE NEGATIVE   Leukocytes, UA NEGATIVE NEGATIVE   RBC / HPF 0-5 0 - 5 RBC/hpf   WBC, UA 0-5 0 - 5 WBC/hpf   Bacteria, UA RARE (A) NONE SEEN   Squamous Epithelial / LPF 6-10 0 - 5   Mucus PRESENT     Comment: Performed at [facility], [address], [place], [place] [address]  I-Stat beta hCG blood, ED     Status: None   Collection Time: 02/10/18 12:16 PM  Result Value Ref Range   I-stat hCG, quantitative <5.0 <5 mIU/mL   Comment 3            Comment:   GEST. AGE      CONC.  (mIU/mL)   <=1 WEEK        5 - 50     2 WEEKS       50 - 500     3  WEEKS       100 - 10,000     4 WEEKS     1,000 - 30,000        [name] AND NON-PREGNANT [name]:     LESS THAN 5 mIU/mL   Basic metabolic panel     Status: Abnormal   Collection Time: 02/11/18  6:57 AM  Result Value Ref Range   Sodium 139 135 - 145 mmol/L   Potassium 3.9 3.5 - 5.1 mmol/L   Chloride 110 98 - 111 mmol/L   CO2 25 22 - 32 mmol/L   Glucose, Bld 110 (H) 70 - 99 mg/dL   BUN 5 (L) 6 - 20 mg/dL   Creatinine, Ser 0.66 0.44 - 1.00 mg/dL   Calcium 8.4 (L) 8.9 - 10.3 mg/dL   GFR calc non Af Amer >60 >60 mL/min   GFR calc Af Amer >60 >60 mL/min    Comment: (NOTE) The eGFR has been calculated using the CKD EPI equation. This calculation has not been validated in all clinical situations. eGFR's persistently <60 mL/min signify possible Chronic Kidney Disease.    Anion gap 4 (L) 5 - 15    Comment: Performed at [facility], [address], [place], [place] [address]  CBC     Status: Abnormal   Collection Time: 02/11/18  6:57 AM  Result Value Ref Range   WBC 5.2 4.0 - 10.5 K/uL   RBC 3.80 (L) 3.87 - 5.11 MIL/uL   Hemoglobin 10.8 (L) 12.0 - 15.0 g/dL   HCT 34.7 (L) 36.0 - 46.0 %   MCV 91.3 78.0 - 100.0 fL   MCH 28.4 26.0 - 34.0 pg   MCHC 31.1 30.0 - 36.0 g/dL   RDW 14.0 11.5 - 15.5 %   Platelets 174 150 - 400 K/uL    Comment: Performed at [facility], [address] [place], [place] [address]    Ct Abdomen Pelvis W Contrast  Result Date: 02/10/2018 CLINICAL DATA:  Acute upper abdominal pain EXAM: CT ABDOMEN AND PELVIS WITH CONTRAST TECHNIQUE: Multidetector CT imaging of the abdomen and  pelvis was performed using the standard protocol following bolus administration of intravenous contrast. CONTRAST:  144m ISOVUE-300 IOPAMIDOL (ISOVUE-300) INJECTION 61% COMPARISON:  None. FINDINGS: Lower chest: No acute abnormality. Hepatobiliary: Gallbladder is well distended. Some mild suggestion of gallbladder wall thickening and pericholecystic fluid is noted. The liver demonstrates some  periportal hypodensity which may be related to the timing of the contrast bolus although the possibility of an underlying hepatic inflammatory change could not be totally excluded. Pancreas: Unremarkable. No pancreatic ductal dilatation or surrounding inflammatory changes. Spleen: Patchy enhancement changes are noted within the spleen likely related to the timing of the contrast bolus. Adrenals/Urinary Tract: Adrenal glands are within normal limits. The kidneys are well visualized bilaterally. No renal calculi or obstructive changes are noted. Bladder is decompressed. Stomach/Bowel: Colon is within normal limits. No small bowel obstructive changes are seen. The appendix is within normal limits. Vascular/Lymphatic: No significant vascular findings are present. No enlarged abdominal or pelvic lymph nodes. Reproductive: Uterus and bilateral adnexa are unremarkable. Changes of prior tubal ligation are noted. Other: No abdominal wall hernia or abnormality. No abdominopelvic ascites. Musculoskeletal: No acute or significant osseous findings. IMPRESSION: Periportal edematous changes which may be related to the timing of the contrast bolus as no significant hepatic venous opacification is noted although the possibility of an underlying hepatic inflammatory change could not be totally excluded. Correlate with hepatic lab values Mild inflammatory changes surrounding the gallbladder. The possibility of cholecystitis deserves consideration but incompletely evaluated on this CT. Right upper quadrant ultrasound may be helpful. Electronically Signed   By: MInez CatalinaM.D.   On: 02/10/2018 14:40    ROS:  Pertinent items are noted in HPI.  Blood pressure 117/70, pulse (!) 59, temperature 98.5 F (36.9 C), temperature source Oral, resp. rate 18, height _0  (1.448 m), weight 79.2 kg, SpO2 100 %. Physical Exam: Pleasant white [name] no acute distress Head is normocephalic, atraumatic Eyes without scleral icterus Lungs  clear to auscultation with equal breath sounds bilaterally Heart examination reveals regular rate and rhythm without S3, S4, murmurs Abdomen is soft with tenderness in the right upper quadrant to palpation.  No hepatosplenomegaly, masses, hernias are identified.  CT scan images personally reviewed  Assessment/Plan: Impression: Right upper quadrant abdominal pain most likely secondary to acute cholecystitis.  Pending ultrasound report, will proceed with laparoscopic cholecystectomy tomorrow.  The risks and benefits of the procedure including bleeding, infection, hepatobiliary injury, and the possibility of an open procedure were fully explained to the patient, who gave informed consent.  MAviva Signs9/15/2019, 9:00 AM

## 2018-02-11 NOTE — H&P (View-Only) (Signed)
Reason for Consult: Right upper quadrant abdominal pain Referring Physician: Dr. Johnson  Michaela [name] is an 26 y.o. female.  HPI: Patient is a 26-year-old white female who presented to the emergency room with a 5-day history of worsening right upper quadrant abdominal pain.  Patient did have nausea but no vomiting.  She was found on CT scan of the abdomen to have pericholecystic edema.  Ultrasound of the right upper quadrant is pending.  Patient continues to have intermittent abdominal pain with an 8 out of 10.  No fever, chills, or jaundice.  History reviewed. No pertinent past medical history.  Past Surgical History:  Procedure Laterality Date  . CESAREAN SECTION    . TUBAL LIGATION      History reviewed. No pertinent family history.  Social History:  reports that she has been smoking cigarettes. She has a 2.00 pack-year smoking history. She has never used smokeless tobacco. She reports that she does not drink alcohol or use drugs.  Allergies:  Allergies  Allergen Reactions  . Pertussis Vaccines Hives    Headache   . Latex Itching and Rash    Medications: I have reviewed the patient's current medications.  Results for orders placed or performed during the hospital encounter of 02/10/18 (from the past 48 hour(s))  Lipase, blood     Status: None   Collection Time: 02/10/18 12:04 PM  Result Value Ref Range   Lipase 27 11 - 51 U/L    Comment: Performed at [facility], [address] [place], [place] [address]  Comprehensive metabolic panel     Status: Abnormal   Collection Time: 02/10/18 12:04 PM  Result Value Ref Range   Sodium 143 135 - 145 mmol/L   Potassium 4.1 3.5 - 5.1 mmol/L   Chloride 110 98 - 111 mmol/L   CO2 25 22 - 32 mmol/L   Glucose, Bld 116 (H) 70 - 99 mg/dL   BUN 12 6 - 20 mg/dL   Creatinine, Ser 0.79 0.44 - 1.00 mg/dL   Calcium 9.1 8.9 - 10.3 mg/dL   Total Protein 7.9 6.5 - 8.1 g/dL   Albumin 4.3 3.5 - 5.0 g/dL   AST 21 15 - 41 U/L   ALT 14 0 - 44  U/L   Alkaline Phosphatase 64 38 - 126 U/L   Total Bilirubin 0.5 0.3 - 1.2 mg/dL   GFR calc non Af Amer >60 >60 mL/min   GFR calc Af Amer >60 >60 mL/min    Comment: (NOTE) The eGFR has been calculated using the CKD EPI equation. This calculation has not been validated in all clinical situations. eGFR's persistently <60 mL/min signify possible Chronic Kidney Disease.    Anion gap 8 5 - 15    Comment: Performed at [facility], [address] [place], [place] [address]  CBC     Status: None   Collection Time: 02/10/18 12:04 PM  Result Value Ref Range   WBC 6.7 4.0 - 10.5 K/uL   RBC 4.51 3.87 - 5.11 MIL/uL   Hemoglobin 12.8 12.0 - 15.0 g/dL   HCT 40.7 36.0 - 46.0 %   MCV 90.2 78.0 - 100.0 fL   MCH 28.4 26.0 - 34.0 pg   MCHC 31.4 30.0 - 36.0 g/dL   RDW 13.8 11.5 - 15.5 %   Platelets 223 150 - 400 K/uL    Comment: Performed at [facility], [address] [place], [place] [address]  Urinalysis, Routine w reflex microscopic       Status: Abnormal   Collection Time: 02/10/18 12:04 PM  Result Value Ref Range   Color, Urine YELLOW YELLOW   APPearance HAZY (A) CLEAR   Specific Gravity, Urine 1.039 (H) 1.005 - 1.030   pH 5.0 5.0 - 8.0   Glucose, UA NEGATIVE NEGATIVE mg/dL   Hgb urine dipstick MODERATE (A) NEGATIVE   Bilirubin Urine NEGATIVE NEGATIVE   Ketones, ur NEGATIVE NEGATIVE mg/dL   Protein, ur 30 (A) NEGATIVE mg/dL   Nitrite NEGATIVE NEGATIVE   Leukocytes, UA NEGATIVE NEGATIVE   RBC / HPF 0-5 0 - 5 RBC/hpf   WBC, UA 0-5 0 - 5 WBC/hpf   Bacteria, UA RARE (A) NONE SEEN   Squamous Epithelial / LPF 6-10 0 - 5   Mucus PRESENT     Comment: Performed at [facility], [address] [place], [place] [address]  I-Stat beta hCG blood, ED     Status: None   Collection Time: 02/10/18 12:16 PM  Result Value Ref Range   I-stat hCG, quantitative <5.0 <5 mIU/mL   Comment 3            Comment:   GEST. AGE      CONC.  (mIU/mL)   <=1 WEEK        5 - 50     2 WEEKS       50 - 500     3  WEEKS       100 - 10,000     4 WEEKS     1,000 - 30,000        FEMALE AND NON-PREGNANT FEMALE:     LESS THAN 5 mIU/mL   Basic metabolic panel     Status: Abnormal   Collection Time: 02/11/18  6:57 AM  Result Value Ref Range   Sodium 139 135 - 145 mmol/L   Potassium 3.9 3.5 - 5.1 mmol/L   Chloride 110 98 - 111 mmol/L   CO2 25 22 - 32 mmol/L   Glucose, Bld 110 (H) 70 - 99 mg/dL   BUN 5 (L) 6 - 20 mg/dL   Creatinine, Ser 0.66 0.44 - 1.00 mg/dL   Calcium 8.4 (L) 8.9 - 10.3 mg/dL   GFR calc non Af Amer >60 >60 mL/min   GFR calc Af Amer >60 >60 mL/min    Comment: (NOTE) The eGFR has been calculated using the CKD EPI equation. This calculation has not been validated in all clinical situations. eGFR's persistently <60 mL/min signify possible Chronic Kidney Disease.    Anion gap 4 (L) 5 - 15    Comment: Performed at [facility], [address] [place], [place] [address]  CBC     Status: Abnormal   Collection Time: 02/11/18  6:57 AM  Result Value Ref Range   WBC 5.2 4.0 - 10.5 K/uL   RBC 3.80 (L) 3.87 - 5.11 MIL/uL   Hemoglobin 10.8 (L) 12.0 - 15.0 g/dL   HCT 34.7 (L) 36.0 - 46.0 %   MCV 91.3 78.0 - 100.0 fL   MCH 28.4 26.0 - 34.0 pg   MCHC 31.1 30.0 - 36.0 g/dL   RDW 14.0 11.5 - 15.5 %   Platelets 174 150 - 400 K/uL    Comment: Performed at [facility], [address] [place], [place] [address]    Ct Abdomen Pelvis W Contrast  Result Date: 02/10/2018 CLINICAL DATA:  Acute upper abdominal pain EXAM: CT ABDOMEN AND PELVIS WITH CONTRAST TECHNIQUE: Multidetector CT imaging of the abdomen and   pelvis was performed using the standard protocol following bolus administration of intravenous contrast. CONTRAST:  100mL ISOVUE-300 IOPAMIDOL (ISOVUE-300) INJECTION 61% COMPARISON:  None. FINDINGS: Lower chest: No acute abnormality. Hepatobiliary: Gallbladder is well distended. Some mild suggestion of gallbladder wall thickening and pericholecystic fluid is noted. The liver demonstrates some  periportal hypodensity which may be related to the timing of the contrast bolus although the possibility of an underlying hepatic inflammatory change could not be totally excluded. Pancreas: Unremarkable. No pancreatic ductal dilatation or surrounding inflammatory changes. Spleen: Patchy enhancement changes are noted within the spleen likely related to the timing of the contrast bolus. Adrenals/Urinary Tract: Adrenal glands are within normal limits. The kidneys are well visualized bilaterally. No renal calculi or obstructive changes are noted. Bladder is decompressed. Stomach/Bowel: Colon is within normal limits. No small bowel obstructive changes are seen. The appendix is within normal limits. Vascular/Lymphatic: No significant vascular findings are present. No enlarged abdominal or pelvic lymph nodes. Reproductive: Uterus and bilateral adnexa are unremarkable. Changes of prior tubal ligation are noted. Other: No abdominal wall hernia or abnormality. No abdominopelvic ascites. Musculoskeletal: No acute or significant osseous findings. IMPRESSION: Periportal edematous changes which may be related to the timing of the contrast bolus as no significant hepatic venous opacification is noted although the possibility of an underlying hepatic inflammatory change could not be totally excluded. Correlate with hepatic lab values Mild inflammatory changes surrounding the gallbladder. The possibility of cholecystitis deserves consideration but incompletely evaluated on this CT. Right upper quadrant ultrasound may be helpful. Electronically Signed   By: Mark  Lukens M.D.   On: 02/10/2018 14:40    ROS:  Pertinent items are noted in HPI.  Blood pressure 117/70, pulse (!) 59, temperature 98.5 F (36.9 C), temperature source Oral, resp. rate 18, height 4' 9" (1.448 m), weight 79.2 kg, SpO2 100 %. Physical Exam: Pleasant white female no acute distress Head is normocephalic, atraumatic Eyes without scleral icterus Lungs  clear to auscultation with equal breath sounds bilaterally Heart examination reveals regular rate and rhythm without S3, S4, murmurs Abdomen is soft with tenderness in the right upper quadrant to palpation.  No hepatosplenomegaly, masses, hernias are identified.  CT scan images personally reviewed  Assessment/Plan: Impression: Right upper quadrant abdominal pain most likely secondary to acute cholecystitis.  Pending ultrasound report, will proceed with laparoscopic cholecystectomy tomorrow.  The risks and benefits of the procedure including bleeding, infection, hepatobiliary injury, and the possibility of an open procedure were fully explained to the patient, who gave informed consent.  Mark Jenkins 02/11/2018, 9:00 AM     

## 2018-02-12 ENCOUNTER — Encounter (HOSPITAL_COMMUNITY): Payer: Self-pay | Admitting: Anesthesiology

## 2018-02-12 ENCOUNTER — Observation Stay (HOSPITAL_COMMUNITY): Payer: Medicaid Other | Admitting: Anesthesiology

## 2018-02-12 ENCOUNTER — Encounter (HOSPITAL_COMMUNITY)
Admission: EM | Disposition: A | Discharge status: Home / Self Care | Payer: Self-pay | Source: Home / Self Care | Attending: Orthopedic Surgery

## 2018-02-12 DIAGNOSIS — E669 Obesity, unspecified: Secondary | ICD-10-CM | POA: Diagnosis present

## 2018-02-12 DIAGNOSIS — K8066 Calculus of gallbladder and bile duct with acute and chronic cholecystitis without obstruction: Secondary | ICD-10-CM | POA: Diagnosis present

## 2018-02-12 DIAGNOSIS — K8050 Calculus of bile duct without cholangitis or cholecystitis without obstruction: Secondary | ICD-10-CM | POA: Diagnosis not present

## 2018-02-12 DIAGNOSIS — F17210 Nicotine dependence, cigarettes, uncomplicated: Secondary | ICD-10-CM | POA: Diagnosis present

## 2018-02-12 DIAGNOSIS — K819 Cholecystitis, unspecified: Secondary | ICD-10-CM | POA: Diagnosis present

## 2018-02-12 DIAGNOSIS — K810 Acute cholecystitis: Secondary | ICD-10-CM | POA: Diagnosis not present

## 2018-02-12 DIAGNOSIS — K2990 Gastroduodenitis, unspecified, without bleeding: Secondary | ICD-10-CM | POA: Diagnosis present

## 2018-02-12 DIAGNOSIS — K915 Postcholecystectomy syndrome: Secondary | ICD-10-CM | POA: Diagnosis not present

## 2018-02-12 DIAGNOSIS — R1011 Right upper quadrant pain: Secondary | ICD-10-CM | POA: Diagnosis present

## 2018-02-12 DIAGNOSIS — Z6832 Body mass index (BMI) 32.0-32.9, adult: Secondary | ICD-10-CM | POA: Diagnosis not present

## 2018-02-12 HISTORY — PX: CHOLECYSTECTOMY: SHX55

## 2018-02-12 LAB — SURGICAL PCR SCREEN
MRSA, PCR: NEGATIVE
Staphylococcus aureus: POSITIVE — AB

## 2018-02-12 LAB — HIV ANTIBODY (ROUTINE TESTING W REFLEX): HIV SCREEN 4TH GENERATION: NONREACTIVE

## 2018-02-12 SURGERY — LAPAROSCOPIC CHOLECYSTECTOMY
Anesthesia: General

## 2018-02-12 MED ORDER — KETOROLAC TROMETHAMINE 30 MG/ML IJ SOLN: 30.0000 mg | Freq: Four times a day (QID) | INTRAMUSCULAR | Status: DC | PRN

## 2018-02-12 MED ORDER — KETOROLAC TROMETHAMINE 30 MG/ML IJ SOLN
30.0000 mg | Freq: Once | INTRAMUSCULAR | Status: AC | PRN
  Administered 2018-02-12: 30 mg via INTRAVENOUS

## 2018-02-12 MED ORDER — MEPERIDINE HCL 50 MG/ML IJ SOLN: 6.2500 mg | INTRAMUSCULAR | Status: DC | PRN

## 2018-02-12 MED ORDER — MUPIROCIN 2 % EX OINT
1.0000 "application " | TOPICAL_OINTMENT | Freq: Two times a day (BID) | CUTANEOUS | Status: DC
  Administered 2018-02-12 – 2018-02-14 (×6): 1 via NASAL
  Filled 2018-02-12 (×2): qty 22

## 2018-02-12 MED ORDER — SIMETHICONE 80 MG PO CHEW: 40.0000 mg | CHEWABLE_TABLET | Freq: Four times a day (QID) | ORAL | Status: DC | PRN

## 2018-02-12 MED ORDER — MIDAZOLAM HCL 5 MG/5ML IJ SOLN
INTRAMUSCULAR | Status: DC | PRN
  Administered 2018-02-12: 2 mg via INTRAVENOUS

## 2018-02-12 MED ORDER — ETOMIDATE 2 MG/ML IV SOLN
INTRAVENOUS | Status: AC
  Filled 2018-02-12: qty 10

## 2018-02-12 MED ORDER — BUPIVACAINE LIPOSOME 1.3 % IJ SUSP
INTRAMUSCULAR | Status: DC | PRN
  Administered 2018-02-12: 20 mL

## 2018-02-12 MED ORDER — POVIDONE-IODINE 10 % OINT PACKET
TOPICAL_OINTMENT | CUTANEOUS | Status: DC | PRN
  Administered 2018-02-12: 1 via TOPICAL

## 2018-02-12 MED ORDER — LACTATED RINGERS IV SOLN
INTRAVENOUS | Status: DC
  Administered 2018-02-12 (×2): via INTRAVENOUS

## 2018-02-12 MED ORDER — ONDANSETRON HCL 4 MG/2ML IJ SOLN: 4.0000 mg | Freq: Once | INTRAMUSCULAR | Status: DC | PRN

## 2018-02-12 MED ORDER — HEMOSTATIC AGENTS (NO CHARGE) OPTIME
TOPICAL | Status: DC | PRN
  Administered 2018-02-12 (×2): 1 via TOPICAL

## 2018-02-12 MED ORDER — PROPOFOL 10 MG/ML IV BOLUS
INTRAVENOUS | Status: DC | PRN
  Administered 2018-02-12: 140 mg via INTRAVENOUS

## 2018-02-12 MED ORDER — FENTANYL CITRATE (PF) 250 MCG/5ML IJ SOLN
INTRAMUSCULAR | Status: AC
  Filled 2018-02-12: qty 5

## 2018-02-12 MED ORDER — KETOROLAC TROMETHAMINE 30 MG/ML IJ SOLN
INTRAMUSCULAR | Status: AC
  Filled 2018-02-12: qty 1

## 2018-02-12 MED ORDER — HYDROCODONE-ACETAMINOPHEN 7.5-325 MG PO TABS: 1.0000 | ORAL_TABLET | Freq: Once | ORAL | Status: DC | PRN

## 2018-02-12 MED ORDER — MIDAZOLAM HCL 2 MG/2ML IJ SOLN
INTRAMUSCULAR | Status: AC
  Filled 2018-02-12: qty 2

## 2018-02-12 MED ORDER — CIPROFLOXACIN IN D5W 400 MG/200ML IV SOLN
INTRAVENOUS | Status: AC
  Filled 2018-02-12: qty 200

## 2018-02-12 MED ORDER — OXYCODONE-ACETAMINOPHEN 5-325 MG PO TABS
1.0000 | ORAL_TABLET | ORAL | Status: DC | PRN
  Administered 2018-02-12 – 2018-02-14 (×9): 2 via ORAL
  Filled 2018-02-12 (×9): qty 2

## 2018-02-12 MED ORDER — CIPROFLOXACIN IN D5W 400 MG/200ML IV SOLN
400.0000 mg | INTRAVENOUS | Status: AC
  Administered 2018-02-12: 400 mg via INTRAVENOUS

## 2018-02-12 MED ORDER — SUCCINYLCHOLINE CHLORIDE 20 MG/ML IJ SOLN
INTRAMUSCULAR | Status: DC | PRN
  Administered 2018-02-12: 140 mg via INTRAVENOUS

## 2018-02-12 MED ORDER — SUGAMMADEX SODIUM 200 MG/2ML IV SOLN
INTRAVENOUS | Status: AC
  Filled 2018-02-12: qty 2

## 2018-02-12 MED ORDER — CHLORHEXIDINE GLUCONATE CLOTH 2 % EX PADS
6.0000 | MEDICATED_PAD | Freq: Every day | CUTANEOUS | Status: DC
  Administered 2018-02-13 – 2018-02-14 (×2): 6 via TOPICAL

## 2018-02-12 MED ORDER — HYDROMORPHONE HCL 1 MG/ML IJ SOLN: 0.2500 mg | INTRAMUSCULAR | Status: DC | PRN

## 2018-02-12 MED ORDER — FENTANYL CITRATE (PF) 100 MCG/2ML IJ SOLN
INTRAMUSCULAR | Status: DC | PRN
  Administered 2018-02-12 (×2): 50 ug via INTRAVENOUS
  Administered 2018-02-12: 100 ug via INTRAVENOUS
  Administered 2018-02-12: 50 ug via INTRAVENOUS

## 2018-02-12 MED ORDER — POVIDONE-IODINE 10 % EX OINT
TOPICAL_OINTMENT | CUTANEOUS | Status: AC
  Filled 2018-02-12: qty 1

## 2018-02-12 MED ORDER — KETOROLAC TROMETHAMINE 30 MG/ML IJ SOLN
30.0000 mg | Freq: Four times a day (QID) | INTRAMUSCULAR | Status: AC
  Filled 2018-02-12: qty 1

## 2018-02-12 MED ORDER — LIDOCAINE HCL 1 % IJ SOLN
INTRAMUSCULAR | Status: DC | PRN
  Administered 2018-02-12: 30 mg via INTRADERMAL

## 2018-02-12 MED ORDER — ROCURONIUM BROMIDE 50 MG/5ML IV SOLN
INTRAVENOUS | Status: AC
  Filled 2018-02-12: qty 1

## 2018-02-12 MED ORDER — SUCCINYLCHOLINE CHLORIDE 20 MG/ML IJ SOLN
INTRAMUSCULAR | Status: AC
  Filled 2018-02-12: qty 1

## 2018-02-12 MED ORDER — SODIUM CHLORIDE 0.9 % IR SOLN
Status: DC | PRN
  Administered 2018-02-12: 3000 mL
  Administered 2018-02-12: 1000 mL

## 2018-02-12 MED ORDER — SUGAMMADEX SODIUM 200 MG/2ML IV SOLN
INTRAVENOUS | Status: DC | PRN
  Administered 2018-02-12: 150 mg via INTRAVENOUS

## 2018-02-12 MED ORDER — BUPIVACAINE LIPOSOME 1.3 % IJ SUSP
INTRAMUSCULAR | Status: AC
  Filled 2018-02-12: qty 20

## 2018-02-12 MED ORDER — PROPOFOL 10 MG/ML IV BOLUS
INTRAVENOUS | Status: AC
  Filled 2018-02-12: qty 20

## 2018-02-12 MED ORDER — ROCURONIUM BROMIDE 100 MG/10ML IV SOLN
INTRAVENOUS | Status: DC | PRN
  Administered 2018-02-12: 25 mg via INTRAVENOUS

## 2018-02-12 MED ORDER — ONDANSETRON HCL 4 MG/2ML IJ SOLN
INTRAMUSCULAR | Status: DC | PRN
  Administered 2018-02-12: 4 mg via INTRAVENOUS

## 2018-02-12 SURGICAL SUPPLY — 55 items
APL SRG 38 LTWT LNG FL B (MISCELLANEOUS) ×1
APPLICATOR ARISTA FLEXITIP XL (MISCELLANEOUS) ×2 IMPLANT
APPLIER CLIP ROT 10 11.4 M/L (STAPLE) ×3
APR CLP MED LRG 11.4X10 (STAPLE) ×1
BAG RETRIEVAL 10 (BASKET) ×1
BAG RETRIEVAL 10MM (BASKET) ×1
CHLORAPREP W/TINT 26ML (MISCELLANEOUS) ×3 IMPLANT
CLIP APPLIE ROT 10 11.4 M/L (STAPLE) ×1 IMPLANT
CLOTH BEACON ORANGE TIMEOUT ST (SAFETY) ×3 IMPLANT
COVER LIGHT HANDLE STERIS (MISCELLANEOUS) ×6 IMPLANT
CUTTER FLEX LINEAR 45M (STAPLE) ×2 IMPLANT
ELECT REM PT RETURN 9FT ADLT (ELECTROSURGICAL) ×3
ELECTRODE REM PT RTRN 9FT ADLT (ELECTROSURGICAL) ×1 IMPLANT
FILTER SMOKE EVAC LAPAROSHD (FILTER) ×3 IMPLANT
GLOVE BIOGEL PI IND STRL 7.0 (GLOVE) ×1 IMPLANT
GLOVE BIOGEL PI INDICATOR 7.0 (GLOVE) ×6
GLOVE SURG SS PI 7.0 STRL IVOR (GLOVE) ×6 IMPLANT
GLOVE SURG SS PI 7.5 STRL IVOR (GLOVE) ×3 IMPLANT
GOWN STRL REUS W/ TWL XL LVL3 (GOWN DISPOSABLE) ×1 IMPLANT
GOWN STRL REUS W/TWL LRG LVL3 (GOWN DISPOSABLE) ×8 IMPLANT
GOWN STRL REUS W/TWL XL LVL3 (GOWN DISPOSABLE) ×3
HEMOSTAT ARISTA ABSORB 3G PWDR (MISCELLANEOUS) ×2 IMPLANT
HEMOSTAT SNOW SURGICEL 2X4 (HEMOSTASIS) ×3 IMPLANT
INST SET LAPROSCOPIC AP (KITS) ×3 IMPLANT
IV NS IRRIG 3000ML ARTHROMATIC (IV SOLUTION) ×2 IMPLANT
KIT TURNOVER KIT A (KITS) ×3 IMPLANT
MANIFOLD NEPTUNE II (INSTRUMENTS) ×3 IMPLANT
NDL HYPO 18GX1.5 BLUNT FILL (NEEDLE) ×1 IMPLANT
NDL INSUFFLATION 14GA 120MM (NEEDLE) ×1 IMPLANT
NEEDLE HYPO 18GX1.5 BLUNT FILL (NEEDLE) ×3 IMPLANT
NEEDLE HYPO 22GX1.5 SAFETY (NEEDLE) ×3 IMPLANT
NEEDLE INSUFFLATION 14GA 120MM (NEEDLE) ×3 IMPLANT
NS IRRIG 1000ML POUR BTL (IV SOLUTION) ×3 IMPLANT
PACK LAP CHOLE LZT030E (CUSTOM PROCEDURE TRAY) ×3 IMPLANT
PAD ARMBOARD 7.5X6 YLW CONV (MISCELLANEOUS) ×3 IMPLANT
RELOAD STAPLE 45 3.5 BLU ETS (ENDOMECHANICALS) IMPLANT
RELOAD STAPLE TA45 3.5 REG BLU (ENDOMECHANICALS) ×3 IMPLANT
SET BASIN LINEN APH (SET/KITS/TRAYS/PACK) ×3 IMPLANT
SET TUBE IRRIG SUCTION NO TIP (IRRIGATION / IRRIGATOR) ×2 IMPLANT
SLEEVE ENDOPATH XCEL 5M (ENDOMECHANICALS) ×3 IMPLANT
SPONGE GAUZE 2X2 8PLY STER LF (GAUZE/BANDAGES/DRESSINGS) ×4
SPONGE GAUZE 2X2 8PLY STRL LF (GAUZE/BANDAGES/DRESSINGS) ×8 IMPLANT
STAPLER VISISTAT (STAPLE) ×3 IMPLANT
SUT VICRYL 0 UR6 27IN ABS (SUTURE) ×3 IMPLANT
SYR 20CC LL (SYRINGE) ×3 IMPLANT
SYS BAG RETRIEVAL 10MM (BASKET) ×1
SYSTEM BAG RETRIEVAL 10MM (BASKET) ×1 IMPLANT
TAPE CLOTH SURG 4X10 WHT LF (GAUZE/BANDAGES/DRESSINGS) ×2 IMPLANT
TROCAR ENDO BLADELESS 11MM (ENDOMECHANICALS) ×3 IMPLANT
TROCAR XCEL NON-BLD 5MMX100MML (ENDOMECHANICALS) ×3 IMPLANT
TROCAR XCEL UNIV SLVE 11M 100M (ENDOMECHANICALS) ×3 IMPLANT
TUBE CONNECTING 12'X1/4 (SUCTIONS) ×1
TUBE CONNECTING 12X1/4 (SUCTIONS) ×2 IMPLANT
TUBING INSUFFLATION (TUBING) ×3 IMPLANT
WARMER LAPAROSCOPE (MISCELLANEOUS) ×3 IMPLANT

## 2018-02-12 NOTE — Anesthesia Procedure Notes (Signed)
Procedure Name: Intubation Date/Time: 02/12/2018 1:53 PM Performed by: Charmaine Downs, CRNA Pre-anesthesia Checklist: Patient identified, Patient being monitored, Timeout performed, Emergency Drugs available and Suction available Patient Re-evaluated:Patient Re-evaluated prior to induction Oxygen Delivery Method: Circle System Utilized Preoxygenation: Pre-oxygenation with 100% oxygen Induction Type: IV induction Ventilation: Mask ventilation without difficulty Laryngoscope Size: Mac and 3 Grade View: Grade II Tube type: Oral Tube size: 7.0 mm Number of attempts: 1 Airway Equipment and Method: stylet Placement Confirmation: ETT inserted through vocal cords under direct vision,  positive ETCO2 and breath sounds checked- equal and bilateral Secured at: 22 cm Tube secured with: Tape Dental Injury: Teeth and Oropharynx as per pre-operative assessment  Comments: Dentition in extreme dis-repair.No damage occurred during intubation.

## 2018-02-12 NOTE — Op Note (Signed)
Patient:  Michaela [name]  DOB:  [birth year]  MRN:  [id number]   Preop Diagnosis: Acute cholecystitis, cholelithiasis  Postop Diagnosis: Same  Procedure: Laparoscopic cholecystectomy  Surgeon: Franky MachoMark Nic Lampe, MD  Anes: General endotracheal  Indications: Patient is a 27 year old white female who presents with acute cholecystitis secondary to cholelithiasis.  The risks and benefits of the procedure including bleeding, infection, hepatobiliary injury, and the possibility of an open procedure were fully explained to the patient, who gave informed consent.  Procedure note: The patient was placed in supine position.  After induction of general endotracheal anesthesia, the abdomen was prepped and draped using the usual sterile technique with DuraPrep.  Surgical site confirmation was performed.  A supraumbilical incision was made down to the fascia.  A Veress needle was introduced into the abdominal cavity and confirmation of placement was done using the saline drop test.  The abdomen was then insufflated to 16 mmHg pressure.  An 11 mm trocar was introduced into the abdominal cavity under direct visualization without difficulty.  The patient was placed in reverse Trendelenburg position and an additional 11 mm trocar was placed in the epigastric region and 5 mm trochars were placed the right upper quadrant and right flank regions.  The liver was inspected and noted to be within normal limits.  The gallbladder was noted to be acutely edematous and distended.  The gallbladder was retracted in a dynamic fashion in order to provide a critical view of the triangle of Calot.  The cystic artery was identified.  It was ligated proximally and distally using clips.  The cystic duct was fully identified but was noted to be inflamed and enlarged.  It was elected to proceed with an Endo GIA.  A standard Endo GIA was placed across the cystic duct and fired.  The gallbladder was then freed away from the gallbladder fossa  using Bovie electrocautery.  The gallbladder was delivered through the epigastric trocar site using an Endo Catch bag.  The gallbladder fossa was inspected and any bleeding was controlled using Bovie electrocautery.  Arista and Surgicel were placed in the gallbladder fossa.  All fluid and air were then evacuated from the abdominal cavity prior to the removal of the trochars.  All wounds were irrigated with normal saline.  All wounds were injected with Exparel.  The incisions were closed using staples.  Betadine ointment and dry sterile dressings were applied.  All tape and needle counts were correct at the end of the procedure.  Patient was extubated in the operating room and transferred to PACU in stable condition.  Complications: None  EBL: Minimal  Specimen: Gallbladder

## 2018-02-12 NOTE — Anesthesia Preprocedure Evaluation (Signed)
Anesthesia Evaluation  Patient identified by MRN, date of birth, ID band Patient awake    Reviewed: Allergy & Precautions, H&P , NPO status , Patient's Chart, lab work & pertinent test results  Airway Mallampati: II  TM Distance: >3 FB Neck ROM: full    Dental no notable dental hx. (+) Chipped, Missing, Poor Dentition, Dental Advidsory Given   Pulmonary neg pulmonary ROS, Current Smoker,    Pulmonary exam normal breath sounds clear to auscultation       Cardiovascular Exercise Tolerance: Good negative cardio ROS   Rhythm:regular Rate:Normal     Neuro/Psych negative neurological ROS  negative psych ROS   GI/Hepatic negative GI ROS, Neg liver ROS,   Endo/Other  negative endocrine ROS  Renal/GU negative Renal ROS  negative genitourinary   Musculoskeletal   Abdominal   Peds  Hematology negative hematology ROS (+)   Anesthesia Other Findings   Reproductive/Obstetrics negative OB ROS                             Anesthesia Physical Anesthesia Plan  ASA: II  Anesthesia Plan: General   Post-op Pain Management:    Induction:   PONV Risk Score and Plan:   Airway Management Planned:   Additional Equipment:   Intra-op Plan:   Post-operative Plan:   Informed Consent: I have reviewed the patients History and Physical, chart, labs and discussed the procedure including the risks, benefits and alternatives for the proposed anesthesia with the patient or authorized representative who has indicated his/her understanding and acceptance.   Dental Advisory Given  Plan Discussed with: CRNA  Anesthesia Plan Comments:         Anesthesia Quick Evaluation

## 2018-02-12 NOTE — Anesthesia Postprocedure Evaluation (Signed)
Anesthesia Post Note  Patient: Michaela [name]  Procedure(s) Performed: LAPAROSCOPIC CHOLECYSTECTOMY (N/A )  Patient location during evaluation: PACU Anesthesia Type: General Level of consciousness: awake and patient cooperative Pain management: pain level controlled Vital Signs Assessment: post-procedure vital signs reviewed and stable Respiratory status: spontaneous breathing, nonlabored ventilation and respiratory function stable Cardiovascular status: blood pressure returned to baseline Postop Assessment: no apparent nausea or vomiting Anesthetic complications: no     Last Vitals:  Vitals:   02/12/18 1200 02/12/18 1510  BP:    Pulse:  88  Resp: 19 (!) 24  Temp:  37.3 C  SpO2: 95% 91%    Last Pain:  Vitals:   02/12/18 1510  TempSrc:   PainSc: Asleep                 [name]

## 2018-02-12 NOTE — Transfer of Care (Signed)
Immediate Anesthesia Transfer of Care Note  Patient: Deri FuellingBrandi S Spratlin  Procedure(s) Performed: LAPAROSCOPIC CHOLECYSTECTOMY (N/A )  Patient Location: PACU  Anesthesia Type:General  Level of Consciousness: drowsy  Airway & Oxygen Therapy: Patient Spontanous Breathing and Patient connected to nasal cannula oxygen  Post-op Assessment: Report given to RN, Post -op Vital signs reviewed and stable and Patient moving all extremities  Post vital signs: Reviewed and stable  Last Vitals:  Vitals Value Taken Time  BP    Temp    Pulse 93 02/12/2018  3:08 PM  Resp    SpO2 94 % 02/12/2018  3:08 PM  Vitals shown include unvalidated device data.  Last Pain:  Vitals:   02/12/18 1228  TempSrc:   PainSc: 8       Patients Stated Pain Goal: 4 (02/12/18 1036)  Complications: No apparent anesthesia complications

## 2018-02-12 NOTE — Interval H&P Note (Signed)
History and Physical Interval Note:  02/12/2018 11:27 AM  Michaela [name]  has presented today for surgery, with the diagnosis of cholecystitis  The various methods of treatment have been discussed with the patient and family. After consideration of risks, benefits and other options for treatment, the patient has consented to  Procedure(s): LAPAROSCOPIC CHOLECYSTECTOMY (N/A) as a surgical intervention .  The patient's history has been reviewed, patient examined, no change in status, stable for surgery.  I have reviewed the patient's chart and labs.  Questions were answered to the patient's satisfaction.     Franky MachoMark Kennady Zimmerle

## 2018-02-12 NOTE — Progress Notes (Signed)
Transported from pacu, alert and oriented.  4 post op sites dry and intact with betadine dry on dressing.  Requested coke and pain medication.  Husband at bedside.

## 2018-02-13 ENCOUNTER — Inpatient Hospital Stay (HOSPITAL_COMMUNITY): Payer: Medicaid Other | Admitting: Anesthesiology

## 2018-02-13 ENCOUNTER — Encounter (HOSPITAL_COMMUNITY): Payer: Self-pay | Admitting: General Surgery

## 2018-02-13 ENCOUNTER — Encounter (HOSPITAL_COMMUNITY)
Admission: EM | Disposition: A | Discharge status: Home / Self Care | Payer: Self-pay | Source: Home / Self Care | Attending: Orthopedic Surgery

## 2018-02-13 ENCOUNTER — Inpatient Hospital Stay (HOSPITAL_COMMUNITY): Payer: Medicaid Other

## 2018-02-13 DIAGNOSIS — K8050 Calculus of bile duct without cholangitis or cholecystitis without obstruction: Secondary | ICD-10-CM

## 2018-02-13 DIAGNOSIS — K2990 Gastroduodenitis, unspecified, without bleeding: Secondary | ICD-10-CM

## 2018-02-13 DIAGNOSIS — K915 Postcholecystectomy syndrome: Secondary | ICD-10-CM

## 2018-02-13 HISTORY — PX: REMOVAL OF STONES: SHX5545

## 2018-02-13 HISTORY — PX: SPHINCTEROTOMY: SHX5279

## 2018-02-13 HISTORY — PX: ERCP: SHX5425

## 2018-02-13 LAB — AMYLASE: AMYLASE: 26 U/L — AB (ref 28–100)

## 2018-02-13 LAB — CBC
HCT: 36 % (ref 36.0–46.0)
Hemoglobin: 11.4 g/dL — ABNORMAL LOW (ref 12.0–15.0)
MCH: 28.4 pg (ref 26.0–34.0)
MCHC: 31.7 g/dL (ref 30.0–36.0)
MCV: 89.8 fL (ref 78.0–100.0)
Platelets: 184 10*3/uL (ref 150–400)
RBC: 4.01 MIL/uL (ref 3.87–5.11)
RDW: 13.8 % (ref 11.5–15.5)
WBC: 4.9 10*3/uL (ref 4.0–10.5)

## 2018-02-13 LAB — COMPREHENSIVE METABOLIC PANEL
ALK PHOS: 211 U/L — AB (ref 38–126)
ALT: 211 U/L — ABNORMAL HIGH (ref 0–44)
AST: 172 U/L — AB (ref 15–41)
Albumin: 3.1 g/dL — ABNORMAL LOW (ref 3.5–5.0)
Anion gap: 7 (ref 5–15)
CALCIUM: 8.4 mg/dL — AB (ref 8.9–10.3)
CHLORIDE: 109 mmol/L (ref 98–111)
CO2: 23 mmol/L (ref 22–32)
Creatinine, Ser: 0.56 mg/dL (ref 0.44–1.00)
GFR calc Af Amer: >60 mL/min (ref >60)
GFR calc non Af Amer: >60 mL/min (ref >60)
Glucose, Bld: 95 mg/dL (ref 70–99)
Potassium: 4.1 mmol/L (ref 3.5–5.1)
Sodium: 139 mmol/L (ref 135–145)
Total Bilirubin: 4.6 mg/dL — ABNORMAL HIGH (ref 0.3–1.2)
Total Protein: 6.2 g/dL — ABNORMAL LOW (ref 6.5–8.1)

## 2018-02-13 SURGERY — ERCP, WITH INTERVENTION IF INDICATED
Anesthesia: General | Site: Esophagus

## 2018-02-13 MED ORDER — EPHEDRINE SULFATE 50 MG/ML IJ SOLN
INTRAMUSCULAR | Status: AC
  Filled 2018-02-13: qty 1

## 2018-02-13 MED ORDER — SUGAMMADEX SODIUM 200 MG/2ML IV SOLN
INTRAVENOUS | Status: DC | PRN
  Administered 2018-02-13: 158.4 mg via INTRAVENOUS

## 2018-02-13 MED ORDER — DEXAMETHASONE SODIUM PHOSPHATE 4 MG/ML IJ SOLN
INTRAMUSCULAR | Status: DC | PRN
  Administered 2018-02-13: 4 mg via INTRAVENOUS

## 2018-02-13 MED ORDER — LACTATED RINGERS IV SOLN
INTRAVENOUS | Status: DC
  Administered 2018-02-13: 13:00:00 via INTRAVENOUS

## 2018-02-13 MED ORDER — GLYCOPYRROLATE 0.2 MG/ML IJ SOLN
INTRAMUSCULAR | Status: AC
  Filled 2018-02-13: qty 1

## 2018-02-13 MED ORDER — GLUCAGON HCL RDNA (DIAGNOSTIC) 1 MG IJ SOLR
INTRAMUSCULAR | Status: AC
  Filled 2018-02-13: qty 1

## 2018-02-13 MED ORDER — ONDANSETRON HCL 4 MG/2ML IJ SOLN
INTRAMUSCULAR | Status: DC | PRN
  Administered 2018-02-13: 4 mg via INTRAVENOUS

## 2018-02-13 MED ORDER — MIDAZOLAM HCL 5 MG/5ML IJ SOLN
INTRAMUSCULAR | Status: DC | PRN
  Administered 2018-02-13: 2 mg via INTRAVENOUS

## 2018-02-13 MED ORDER — ROCURONIUM BROMIDE 50 MG/5ML IV SOSY
PREFILLED_SYRINGE | INTRAVENOUS | Status: DC | PRN
  Administered 2018-02-13: 20 mg via INTRAVENOUS

## 2018-02-13 MED ORDER — HYDROMORPHONE HCL 1 MG/ML IJ SOLN: 0.2500 mg | INTRAMUSCULAR | Status: DC | PRN

## 2018-02-13 MED ORDER — KETOROLAC TROMETHAMINE 30 MG/ML IJ SOLN: 30.0000 mg | Freq: Once | INTRAMUSCULAR | Status: AC

## 2018-02-13 MED ORDER — GLYCOPYRROLATE PF 0.2 MG/ML IJ SOSY
PREFILLED_SYRINGE | INTRAMUSCULAR | Status: DC | PRN
  Administered 2018-02-13: .2 mg via INTRAVENOUS

## 2018-02-13 MED ORDER — PANTOPRAZOLE SODIUM 40 MG PO TBEC
40.0000 mg | DELAYED_RELEASE_TABLET | Freq: Every day | ORAL | Status: DC
  Administered 2018-02-14: 40 mg via ORAL
  Filled 2018-02-13: qty 1

## 2018-02-13 MED ORDER — HYDROCODONE-ACETAMINOPHEN 7.5-325 MG PO TABS: 1.0000 | ORAL_TABLET | Freq: Once | ORAL | Status: DC | PRN

## 2018-02-13 MED ORDER — LIDOCAINE HCL (CARDIAC) PF 50 MG/5ML IV SOSY
PREFILLED_SYRINGE | INTRAVENOUS | Status: DC | PRN
  Administered 2018-02-13: 40 mg via INTRAVENOUS

## 2018-02-13 MED ORDER — SODIUM CHLORIDE 0.9 % IV SOLN: INTRAVENOUS | Status: DC

## 2018-02-13 MED ORDER — ARTIFICIAL TEARS OPHTHALMIC OINT
TOPICAL_OINTMENT | OPHTHALMIC | Status: DC | PRN
  Administered 2018-02-13: 1 via OPHTHALMIC

## 2018-02-13 MED ORDER — ONDANSETRON HCL 4 MG/2ML IJ SOLN: 4.0000 mg | Freq: Once | INTRAMUSCULAR | Status: DC | PRN

## 2018-02-13 MED ORDER — LIDOCAINE HCL (PF) 1 % IJ SOLN
INTRAMUSCULAR | Status: AC
  Filled 2018-02-13: qty 5

## 2018-02-13 MED ORDER — FENTANYL CITRATE (PF) 100 MCG/2ML IJ SOLN
INTRAMUSCULAR | Status: DC | PRN
  Administered 2018-02-13: 50 ug via INTRAVENOUS
  Administered 2018-02-13: 100 ug via INTRAVENOUS
  Administered 2018-02-13 (×2): 50 ug via INTRAVENOUS

## 2018-02-13 MED ORDER — FENTANYL CITRATE (PF) 250 MCG/5ML IJ SOLN
INTRAMUSCULAR | Status: AC
  Filled 2018-02-13: qty 5

## 2018-02-13 MED ORDER — ONDANSETRON HCL 4 MG/2ML IJ SOLN
INTRAMUSCULAR | Status: AC
  Filled 2018-02-13: qty 2

## 2018-02-13 MED ORDER — SODIUM CHLORIDE 0.9 % IJ SOLN
INTRAMUSCULAR | Status: AC
  Filled 2018-02-13: qty 10

## 2018-02-13 MED ORDER — MIDAZOLAM HCL 2 MG/2ML IJ SOLN
INTRAMUSCULAR | Status: AC
  Filled 2018-02-13: qty 2

## 2018-02-13 MED ORDER — PROPOFOL 10 MG/ML IV BOLUS
INTRAVENOUS | Status: DC | PRN
  Administered 2018-02-13: 150 mg via INTRAVENOUS

## 2018-02-13 MED ORDER — DEXAMETHASONE SODIUM PHOSPHATE 4 MG/ML IJ SOLN
INTRAMUSCULAR | Status: AC
  Filled 2018-02-13: qty 1

## 2018-02-13 MED ORDER — MEPERIDINE HCL 100 MG/ML IJ SOLN: 6.2500 mg | INTRAMUSCULAR | Status: DC | PRN

## 2018-02-13 MED ORDER — SUCCINYLCHOLINE CHLORIDE 20 MG/ML IJ SOLN
INTRAMUSCULAR | Status: AC
  Filled 2018-02-13: qty 1

## 2018-02-13 MED ORDER — KETOROLAC TROMETHAMINE 30 MG/ML IJ SOLN: 30.0000 mg | Freq: Once | INTRAMUSCULAR | Status: DC | PRN

## 2018-02-13 MED ORDER — SUCCINYLCHOLINE CHLORIDE 20 MG/ML IJ SOLN
INTRAMUSCULAR | Status: DC | PRN
  Administered 2018-02-13: 140 mg via INTRAVENOUS

## 2018-02-13 MED ORDER — IOPAMIDOL (ISOVUE-300) INJECTION 61%
INTRAVENOUS | Status: AC
  Administered 2018-02-13: 12:00:00
  Filled 2018-02-13: qty 100

## 2018-02-13 MED ORDER — ROCURONIUM BROMIDE 50 MG/5ML IV SOLN
INTRAVENOUS | Status: AC
  Filled 2018-02-13: qty 1

## 2018-02-13 MED ORDER — PROPOFOL 10 MG/ML IV BOLUS
INTRAVENOUS | Status: AC
  Filled 2018-02-13: qty 40

## 2018-02-13 NOTE — Consult Note (Signed)
Referring Provider: Franky Macho, MD Primary Care Physician:  Patient, No Pcp Per Primary Gastroenterologist:  Dr. Karilyn Cota  Reason for Consultation:    Postcholecystectomy bump in bilirubin and transaminases.  HPI:   Patient is 27 year old Caucasian female who is 6 months postpartum and had been experiencing right upper quadrant pain with nausea and vomiting intermittently for the last 2 months or so.  She came to emergency room on 02/10/2018 with persistent symptoms of pain and intermittent nausea and vomiting for about a week.  On evaluation she was found to have cholelithiasis and pericholecystic fluid and some wall thickening CT.  She also had upper abdominal ultrasound revealing cholelithiasis borderline gallbladder wall thickening and bile duct measured between 5 to 7 mm but there were no stones.  Patient's LFTs were normal. He went laparoscopic cholecystectomy yesterday.  This morning patient's LFTs are abnormal.  Her bilirubin is 4.6, AP to 211, AST 172 and ALT 211. Patient complains of mild pain when she moves in certain way.  She feels his pain is very to her surgery and not a deep pain.  She denies nausea vomiting fever or chills.  Review of the systems is negative for heartburn dysphagia melena or rectal bleeding.  She states she has gained over 10 pounds since her pregnancy ended.  She has 2 children ages 6 years and 6 months. She is engaged to be married.  Her fianc Selena Batten is at bedside. She smokes about half a pack of cigarettes per day which she has done for 8 years.  She drinks alcohol occasionally. She has 1 brother in fair health.   Past medical history: Facial acne. Obesity.  Past Surgical History:  Procedure Laterality Date  . CESAREAN SECTION    . TUBAL LIGATION      Prior to Admission medications   Not on File    Current Facility-Administered Medications  Medication Dose Route Frequency Provider Last Rate Last Dose  . 0.9 % NaCl with KCl 20 mEq/ L  infusion    Intravenous Continuous Franky Macho, MD 100 mL/hr at 02/13/18 1610    . acetaminophen (TYLENOL) tablet 650 mg  650 mg Oral Q6H PRN Franky Macho, MD   650 mg at 02/11/18 1222   Or  . acetaminophen (TYLENOL) suppository 650 mg  650 mg Rectal Q6H PRN Franky Macho, MD      . cefTRIAXone (ROCEPHIN) 2 g in sodium chloride 0.9 % 100 mL IVPB  2 g Intravenous Q24H Franky Macho, MD   Stopped at 02/11/18 1716  . Chlorhexidine Gluconate Cloth 2 % PADS 6 each  6 each Topical Daily Franky Macho, MD      . enoxaparin (LOVENOX) injection 40 mg  40 mg Subcutaneous Q24H Franky Macho, MD      . HYDROmorphone (DILAUDID) injection 1 mg  1 mg Intravenous Q2H PRN Franky Macho, MD   1 mg at 02/12/18 0830  . ketorolac (TORADOL) 30 MG/ML injection 30 mg  30 mg Intravenous Q6H PRN Franky Macho, MD      . mupirocin ointment (BACTROBAN) 2 % 1 application  1 application Nasal BID Franky Macho, MD   1 application at 02/12/18 2131  . nicotine (NICODERM CQ - dosed in mg/24 hours) patch 21 mg  21 mg Transdermal Daily Franky Macho, MD   21 mg at 02/11/18 0909  . ondansetron (ZOFRAN) tablet 4 mg  4 mg Oral Q6H PRN Franky Macho, MD       Or  . ondansetron [facility]) injection 4 mg  4 mg Intravenous Q6H PRN Franky MachoJenkins, Mark, MD   4 mg at 02/12/18 0534  . oxyCODONE-acetaminophen (PERCOCET/ROXICET) 5-325 MG per tablet 1-2 tablet  1-2 tablet Oral Q4H PRN Franky MachoJenkins, Mark, MD   2 tablet at 02/13/18 0451  . senna-docusate (Senokot-S) tablet 1 tablet  1 tablet Oral QHS PRN Franky MachoJenkins, Mark, MD      . simethicone [facility](MYLICON) chewable tablet 40 mg  40 mg Oral Q6H PRN Franky MachoJenkins, Mark, MD      . zolpidem [facility](AMBIEN) tablet 5 mg  5 mg Oral QHS PRN Franky MachoJenkins, Mark, MD   5 mg at 02/12/18 2344    Allergies as of 02/10/2018 - Review Complete 02/10/2018  Allergen Reaction Noted  . Pertussis vaccines Hives 02/10/2018  . Latex Itching and Rash 04/24/2012    History reviewed. No pertinent family history.  Social History   Socioeconomic History  .  Marital status: Single    Spouse name: Not on file  . Number of children: Not on file  . Years of education: Not on file  . Highest education level: Not on file  Occupational History  . Not on file  Social Needs  . Financial resource strain: Not on file  . Food insecurity:    Worry: Not on file    Inability: Not on file  . Transportation needs:    Medical: Not on file    Non-medical: Not on file  Tobacco Use  . Smoking status: Current Every Day Smoker    Packs/day: 0.50    Years: 4.00    Pack years: 2.00    Types: Cigarettes  . Smokeless tobacco: Never Used  Substance and Sexual Activity  . Alcohol use: No  . Drug use: No  . Sexual activity: Not on file  Lifestyle  . Physical activity:    Days per week: Not on file    Minutes per session: Not on file  . Stress: Not on file  Relationships  . Social connections:    Talks on phone: Not on file    Gets together: Not on file    Attends religious service: Not on file    Active member of club or organization: Not on file    Attends meetings of clubs or organizations: Not on file    Relationship status: Not on file  . Intimate partner violence:    Fear of current or ex partner: Not on file    Emotionally abused: Not on file    Physically abused: Not on file    Forced sexual activity: Not on file  Other Topics Concern  . Not on file  Social History Narrative  . Not on file    Review of Systems: See HPI, otherwise normal ROS  Physical Exam: Temp:  [98.6 F (37 C)-99.7 F (37.6 C)] 99.6 F (37.6 C) (09/17 0657) Pulse Rate:  [75-102] 102 (09/17 0657) Resp:  [16-24] 16 (09/17 0657) BP: (102-130)/(55-83) 102/66 (09/17 0657) SpO2:  [90 %-100 %] 96 % (09/17 0657) Last BM Date: 02/10/18  Patient is alert and in no acute distress. Conjunctiva is pink.  Sclerae mildly icteric. Oral pharyngeal mucosa is normal.  Dentition in poor condition. No thyromegaly or adenopathy noted. Cardiac exam with regular rhythm normal S1  and S2.  No murmur gallop noted. Lungs are clear to auscultation. Abdomen is full.  Laparoscopy wounds are covered with sterile dressing.  Bowel sounds are normal.  On palpation abdomen is soft.  She has mild midepigastric tenderness.  No organomegaly or masses.  No peripheral edema or clubbing noted. She has multiple tattoos including one on each arm as well as left hand and wrist left ankle as well as her back.   Lab Results: Recent Labs    02/10/18 1204 02/11/18 0657 02/13/18 0521  WBC 6.7 5.2 4.9  HGB 12.8 10.8* 11.4*  HCT 40.7 34.7* 36.0  PLT 223 174 184   BMET Recent Labs    02/10/18 1204 02/11/18 0657 02/13/18 0521  NA 143 139 139  K 4.1 3.9 4.1  CL 110 110 109  CO2 25 25 23   GLUCOSE 116* 110* 95  BUN 12 5* <5*  CREATININE 0.79 0.66 0.56  CALCIUM 9.1 8.4* 8.4*   LFT Recent Labs    02/13/18 0521  PROT 6.2*  ALBUMIN 3.1*  AST 172*  ALT 211*  ALKPHOS 211*  BILITOT 4.6*    Studies/Results: US Abdomen Limited Ruq  Result Date: 02/11/2018 CLINICAL DATA:  Acute cholecystitis. RIGHT upper quadrant pain. Nausea and vomiting for 1 week. EXAM: ULTRASOUND ABDOMEN LIMITED RIGHT UPPER QUADRANT COMPARISON:  CT abdomen dated 02/10/2018. FINDINGS: Gallbladder: Multiple small gallstones, largest stone measuring 8 mm. Gallbladder wall measures 3-4 mm thickness and there is subtle pericholecystic fluid/edema. Common bile duct: Diameter: Upper limits of normal with measurements of 5-7 mm. Liver: No focal lesion identified. Within normal limits in parenchymal echogenicity. Portal vein is patent on color Doppler imaging with normal direction of blood flow towards the liver. IMPRESSION: 1. Gallbladder wall thickness is upper normal at 3 to 4 mm, however, there is subtle pericholecystic fluid/edema. No sonographic Murphy's sign was elicited during today's exam, per the sonographer report. Findings remain equivocal for acute cholecystitis. Would consider nuclear medicine HIDA scan for  definitive characterization. 2. Common bile duct is upper normal with measurements of 5-7 mm, but certainly prominent for age. No common bile duct stone appreciated on today's exam, however, the distal CBD is obscured by overlying bowel gas. If liver function tests are abnormal, would consider MRCP or ERCP. 3. Multiple small gallstones, largest measuring 8 mm. 4. Liver appears normal. Electronically Signed   By: Bary Richard M.D.   On: 02/11/2018 11:32   Have reviewed ultrasound and CT.  Assessment;  Patient is a 27 year old Caucasian female who underwent laparoscopic cholecystectomy yesterday for acute calculus cholelithiasis.  Prior imaging studies were negative for choledocholithiasis.  On ultrasound bile duct measured between 5 to 7 mm.  Her transaminases were normal.  Postprocedure she has developed jaundice and bump in transaminases.  She possibly has choledocholithiasis.  Bile duct injury is also differential diagnosis. Patient will benefit from ERCP with stone extraction and/or biliary stenting.  Recommendations;  NPO. Will check serum amylase on blood from this morning. ERCP later today with biliary sphincterotomy and stone extraction and/or biliary stenting.  I have reviewed the procedure and risks with the patient and her fianc Selena Batten who is at bedside.  They are both agreeable.   LOS: 1 day   Najeeb Rehman  02/13/2018, 8:09 AM

## 2018-02-13 NOTE — Transfer of Care (Signed)
Immediate Anesthesia Transfer of Care Note  Patient: Michaela [name]  Procedure(s) Performed: ENDOSCOPIC RETROGRADE CHOLANGIOPANCREATOGRAPHY (ERCP) (N/A Esophagus) SPHINCTEROTOMY (N/A Esophagus) REMOVAL OF STONES (N/A Esophagus)  Patient Location: PACU  Anesthesia Type:MAC  Level of Consciousness: drowsy and patient cooperative  Airway & Oxygen Therapy: Patient Spontanous Breathing  Post-op Assessment: Report given to RN and Post -op Vital signs reviewed and stable  Post vital signs: Reviewed and stable  Last Vitals:  Vitals Value Taken Time  BP    Temp    Pulse    Resp    SpO2      Last Pain:  Vitals:   02/13/18 1210  TempSrc: Oral  PainSc: 6       Patients Stated Pain Goal: 4 (72/62/03 5597)  Complications: No apparent anesthesia complications

## 2018-02-13 NOTE — Anesthesia Preprocedure Evaluation (Signed)
Anesthesia Evaluation  Patient identified by MRN, date of birth, ID band Patient awake    Reviewed: Allergy & Precautions, H&P , NPO status , Patient's Chart, lab work & pertinent test results  Airway Mallampati: II  TM Distance: >3 FB Neck ROM: full    Dental no notable dental hx. (+) Chipped, Missing, Poor Dentition, Dental Advidsory Given   Pulmonary neg pulmonary ROS, Current Smoker,    Pulmonary exam normal breath sounds clear to auscultation       Cardiovascular Exercise Tolerance: Good negative cardio ROS   Rhythm:regular Rate:Normal     Neuro/Psych negative neurological ROS  negative psych ROS   GI/Hepatic negative GI ROS, Neg liver ROS,   Endo/Other  negative endocrine ROS  Renal/GU negative Renal ROS  negative genitourinary   Musculoskeletal   Abdominal   Peds  Hematology negative hematology ROS (+)   Anesthesia Other Findings S/P Lap Chole yesterday without anesthesia complications  Reproductive/Obstetrics negative OB ROS                             Anesthesia Physical  Anesthesia Plan  ASA: II  Anesthesia Plan: General   Post-op Pain Management:    Induction:   PONV Risk Score and Plan:   Airway Management Planned:   Additional Equipment:   Intra-op Plan:   Post-operative Plan:   Informed Consent: I have reviewed the patients History and Physical, chart, labs and discussed the procedure including the risks, benefits and alternatives for the proposed anesthesia with the patient or authorized representative who has indicated his/her understanding and acceptance.   Dental Advisory Given  Plan Discussed with: CRNA  Anesthesia Plan Comments:         Anesthesia Quick Evaluation

## 2018-02-13 NOTE — Anesthesia Postprocedure Evaluation (Signed)
Anesthesia Post Note  Patient: Michaela [name]  Procedure(s) Performed: ENDOSCOPIC RETROGRADE CHOLANGIOPANCREATOGRAPHY (ERCP) (N/A Esophagus) SPHINCTEROTOMY (N/A Esophagus) REMOVAL OF STONES (N/A Esophagus)  Patient location during evaluation: PACU Anesthesia Type: General Level of consciousness: awake and alert and oriented Pain management: pain level controlled Vital Signs Assessment: post-procedure vital signs reviewed and stable Cardiovascular status: stable Postop Assessment: no apparent nausea or vomiting Anesthetic complications: no     Last Vitals:  Vitals:   02/13/18 1210 02/13/18 1352  BP: 120/70 (P) 115/65  Pulse: (!) 101   Resp:  (!) (P) 22  Temp: 37.4 C (P) 37.2 C  SpO2: 94% (P) 90%    Last Pain:  Vitals:   02/13/18 1352  TempSrc:   PainSc: (P) Asleep                 [name]

## 2018-02-13 NOTE — Anesthesia Procedure Notes (Signed)
Procedure Name: Intubation Date/Time: 02/13/2018 1:09 PM Performed by: Andree Elk, Amy A, CRNA Pre-anesthesia Checklist: Patient identified, Patient being monitored, Timeout performed, Emergency Drugs available and Suction available Patient Re-evaluated:Patient Re-evaluated prior to induction Oxygen Delivery Method: Circle system utilized Preoxygenation: Pre-oxygenation with 100% oxygen Induction Type: IV induction Ventilation: Mask ventilation without difficulty Laryngoscope Size: Mac and 3 Grade View: Grade I Tube type: Oral Tube size: 7.0 mm Number of attempts: 1 Airway Equipment and Method: Stylet Placement Confirmation: ETT inserted through vocal cords under direct vision,  positive ETCO2 and breath sounds checked- equal and bilateral Secured at: 21 cm Tube secured with: Tape Dental Injury: Teeth and Oropharynx as per pre-operative assessment

## 2018-02-13 NOTE — Progress Notes (Signed)
1 Day Post-Op  Subjective: Patient having mild incisional pain.  Objective: Vital signs in last 24 hours: Temp:  [98.6 F (37 C)-99.7 F (37.6 C)] 99.6 F (37.6 C) (09/17 0657) Pulse Rate:  [75-102] 102 (09/17 0657) Resp:  [16-24] 16 (09/17 0657) BP: (102-130)/(55-83) 102/66 (09/17 0657) SpO2:  [90 %-100 %] 96 % (09/17 0657) Last BM Date: 02/10/18  Intake/Output from previous day: 09/16 0701 - 09/17 0700 In: 2726.7 [P.O.:240; I.V.:2486.7] Out: 850 [Urine:800; Blood:50] Intake/Output this shift: No intake/output data recorded.  General appearance: alert, cooperative and no distress Resp: clear to auscultation bilaterally Cardio: regular rate and rhythm, S1, S2 normal, no murmur, click, rub or gallop GI: Soft, incisions healing well.  Lab Results:  Recent Labs    02/11/18 0657 02/13/18 0521  WBC 5.2 4.9  HGB 10.8* 11.4*  HCT 34.7* 36.0  PLT 174 184   BMET Recent Labs    02/11/18 0657 02/13/18 0521  NA 139 139  K 3.9 4.1  CL 110 109  CO2 25 23  GLUCOSE 110* 95  BUN 5* <5*  CREATININE 0.66 0.56  CALCIUM 8.4* 8.4*   PT/INR No results for input(s): LABPROT, INR in the last 72 hours.  Studies/Results: [place] Abdomen Limited Ruq  Result Date: 02/11/2018 CLINICAL DATA:  Acute cholecystitis. RIGHT upper quadrant pain. Nausea and vomiting for 1 week. EXAM: ULTRASOUND ABDOMEN LIMITED RIGHT UPPER QUADRANT COMPARISON:  CT abdomen dated 02/10/2018. FINDINGS: Gallbladder: Multiple small gallstones, largest stone measuring 8 mm. Gallbladder wall measures 3-4 mm thickness and there is subtle pericholecystic fluid/edema. Common bile duct: Diameter: Upper limits of normal with measurements of 5-7 mm. Liver: No focal lesion identified. Within normal limits in parenchymal echogenicity. Portal vein is patent on color Doppler imaging with normal direction of blood flow towards the liver. IMPRESSION: 1. Gallbladder wall thickness is upper normal at 3 to 4 mm, however, there is subtle  pericholecystic fluid/edema. No sonographic Murphy's sign was elicited during today's exam, per the sonographer report. Findings remain equivocal for acute cholecystitis. Would consider nuclear medicine HIDA scan for definitive characterization. 2. Common bile duct is upper normal with measurements of 5-7 mm, but certainly prominent for age. No common bile duct stone appreciated on today's exam, however, the distal CBD is obscured by overlying bowel gas. If liver function tests are abnormal, would consider MRCP or ERCP. 3. Multiple small gallstones, largest measuring 8 mm. 4. Liver appears normal. Electronically Signed   By: Bary RichardStan  Maynard M.D.   On: 02/11/2018 11:32    Anti-infectives: Anti-infectives (From admission, onward)   Start     Dose/Rate Route Frequency Ordered Stop   02/12/18 1045  ciprofloxacin (CIPRO) IVPB 400 mg     400 mg 200 mL/hr over 60 Minutes Intravenous On call to O.R. 02/12/18 1038 02/12/18 1340   02/10/18 1545  cefTRIAXone (ROCEPHIN) 2 g in sodium chloride 0.9 % 100 mL IVPB     2 g 200 mL/hr over 30 Minutes Intravenous Every 24 hours 02/10/18 1534        Assessment/Plan: s/p Procedure(s): LAPAROSCOPIC CHOLECYSTECTOMY Impression: Patient has had elevations in her liver enzyme test on postoperative day 1.  Dr. Karilyn Cotaehman has been consulted for ERCP to assess for retained common bile duct stone, hepatobiliary injury.  Dr. Karilyn Cotaehman has been notified.  Patient is aware.  LOS: 1 day    [name] 02/13/2018

## 2018-02-13 NOTE — Progress Notes (Signed)
Brief ERCP note:  Erosive gastroduodenitis. Distal CBD at upper limit of normal with single filling defects stone. CHD and intrahepatic biliary radicles normal. Will re-sphincterotomy performed and stone removed using Dormia basket. PD was not cannulated or filled with contrast.

## 2018-02-14 LAB — COMPREHENSIVE METABOLIC PANEL
ALT: 144 U/L — ABNORMAL HIGH (ref 0–44)
AST: 62 U/L — AB (ref 15–41)
Albumin: 2.9 g/dL — ABNORMAL LOW (ref 3.5–5.0)
Alkaline Phosphatase: 181 U/L — ABNORMAL HIGH (ref 38–126)
Anion gap: 6 (ref 5–15)
BILIRUBIN TOTAL: 2.5 mg/dL — AB (ref 0.3–1.2)
CO2: 24 mmol/L (ref 22–32)
Calcium: 8.7 mg/dL — ABNORMAL LOW (ref 8.9–10.3)
Chloride: 109 mmol/L (ref 98–111)
Creatinine, Ser: 0.59 mg/dL (ref 0.44–1.00)
GFR calc Af Amer: >60 mL/min (ref >60)
Glucose, Bld: 90 mg/dL (ref 70–99)
POTASSIUM: 4.1 mmol/L (ref 3.5–5.1)
Sodium: 139 mmol/L (ref 135–145)
TOTAL PROTEIN: 6.2 g/dL — AB (ref 6.5–8.1)

## 2018-02-14 LAB — CBC
HEMATOCRIT: 34.2 % — AB (ref 36.0–46.0)
Hemoglobin: 10.9 g/dL — ABNORMAL LOW (ref 12.0–15.0)
MCH: 28.7 pg (ref 26.0–34.0)
MCHC: 31.9 g/dL (ref 30.0–36.0)
MCV: 90 fL (ref 78.0–100.0)
Platelets: 172 10*3/uL (ref 150–400)
RBC: 3.8 MIL/uL — ABNORMAL LOW (ref 3.87–5.11)
RDW: 13.9 % (ref 11.5–15.5)
WBC: 5.5 10*3/uL (ref 4.0–10.5)

## 2018-02-14 LAB — AMYLASE: AMYLASE: 15 U/L — AB (ref 28–100)

## 2018-02-14 MED ORDER — OMEPRAZOLE 20 MG PO CPDR: 20.0000 mg | DELAYED_RELEASE_CAPSULE | Freq: Every day | ORAL | 1 refills | Status: AC

## 2018-02-14 MED ORDER — OXYCODONE-ACETAMINOPHEN 7.5-325 MG PO TABS: 1.0000 | ORAL_TABLET | ORAL | 0 refills | Status: DC | PRN

## 2018-02-14 NOTE — Discharge Instructions (Signed)
Laparoscopic Cholecystectomy, Care After °This sheet gives you information about how to care for yourself after your procedure. Your doctor may also give you more specific instructions. If you have problems or questions, contact your doctor. °Follow these instructions at home: °Care for cuts from surgery (incisions) ° °· Follow instructions from your doctor about how to take care of your cuts from surgery. Make sure you: °? Wash your hands with soap and water before you change your bandage (dressing). If you cannot use soap and water, use hand sanitizer. °? Change your bandage as told by your doctor. °? Leave stitches (sutures), skin glue, or skin tape (adhesive) strips in place. They may need to stay in place for 2 weeks or longer. If tape strips get loose and curl up, you may trim the loose edges. Do not remove tape strips completely unless your doctor says it is okay. °· Do not take baths, swim, or use a hot tub until your doctor says it is okay. Ask your doctor if you can take showers. You may only be allowed to take sponge baths for bathing. °· Check your surgical cut area every day for signs of infection. Check for: °? More redness, swelling, or pain. °? More fluid or blood. °? Warmth. °? Pus or a bad smell. °Activity °· Do not drive or use heavy machinery while taking prescription pain medicine. °· Do not lift anything that is heavier than 10 lb (4.5 kg) until your doctor says it is okay. °· Do not play contact sports until your doctor says it is okay. °· Do not drive for 24 hours if you were given a medicine to help you relax (sedative). °· Rest as needed. Do not return to work or school until your doctor says it is okay. °General instructions °· Take over-the-counter and prescription medicines only as told by your doctor. °· To prevent or treat constipation while you are taking prescription pain medicine, your doctor may recommend that you: °? Drink enough fluid to keep your pee (urine) clear or pale  yellow. °? Take over-the-counter or prescription medicines. °? Eat foods that are high in fiber, such as fresh fruits and vegetables, whole grains, and beans. °? Limit foods that are high in fat and processed sugars, such as fried and sweet foods. °Contact a doctor if: °· You develop a rash. °· You have more redness, swelling, or pain around your surgical cuts. °· You have more fluid or blood coming from your surgical cuts. °· Your surgical cuts feel warm to the touch. °· You have pus or a bad smell coming from your surgical cuts. °· You have a fever. °· One or more of your surgical cuts breaks open. °Get help right away if: °· You have trouble breathing. °· You have chest pain. °· You have pain that is getting worse in your shoulders. °· You faint or feel dizzy when you stand. °· You have very bad pain in your belly (abdomen). °· You are sick to your stomach (nauseous) for more than one day. °· You have throwing up (vomiting) that lasts for more than one day. °· You have leg pain. °This information is not intended to replace advice given to you by your health care provider. Make sure you discuss any questions you have with your health care provider. °Document Released: 02/23/2008 Document Revised: 12/05/2015 Document Reviewed: 11/02/2015 °Elsevier Interactive Patient Education © 2018 [employer] ° °

## 2018-02-14 NOTE — Progress Notes (Signed)
Patient ID: Michaela [name], female   DOB: 10-May-1991, 27 y.o.   MRN: [id number] States she feels 80% better. Sleepy. CMP Latest Ref Rng & Units 02/14/2018 02/13/2018 02/11/2018  Glucose 70 - 99 mg/dL 90 95 409(W110(H)  BUN 6 - 20 mg/dL <1(X<5(L) <9(J<5(L) 5(L)  Creatinine 0.44 - 1.00 mg/dL 4.780.59 2.950.56 6.210.66  Sodium 135 - 145 mmol/L 139 139 139  Potassium 3.5 - 5.1 mmol/L 4.1 4.1 3.9  Chloride 98 - 111 mmol/L 109 109 110  CO2 22 - 32 mmol/L 24 23 25   Calcium 8.9 - 10.3 mg/dL 3.0(Q8.7(L) 6.5(H8.4(L) 8.4(O8.4(L)  Total Protein 6.5 - 8.1 g/dL 6.2(L) 6.2(L) -  Total Bilirubin 0.3 - 1.2 mg/dL 2.5(H) 4.6(H) -  Alkaline Phos 38 - 126 U/L 181(H) 211(H) -  AST 15 - 41 U/L 62(H) 172(H) -  ALT 0 - 44 U/L 144(H) 211(H) -  Liver numbers are better.  Underwent an ERCP yesterday with sphincterotomy and stone extraction. Will continue to follow.

## 2018-02-14 NOTE — Op Note (Signed)
NAME: Michaela [name], Michaela S. MEDICAL RECORD ZO:[id number] ACCOUNT [id number] DATE OF BIRTH:08-23-1990 FACILITY: [facility] LOCATION: [facility] PHYSICIAN:NAJEEB Cline CrockU. REHMAN, MD  OPERATIVE REPORT  DATE OF PROCEDURE:  02/13/2018  PROCEDURE:  Endoscopic retrograde cholangiopancreatography with sphincterotomy and stone extraction.  INDICATIONS:  The patient is a 27 year old Caucasian female who underwent laparoscopic cholecystectomy yesterday for acute calculous cholecystitis.  The patient's LFTs are abnormal.  Her bilirubin is up to 4.6 and transaminases are also elevated.  Her  transaminases were normal on 02/10/2018.  She is undergoing ERCP both for diagnostic and therapeutic purposes.  She is suspected to have common duct stone and other concern would be one of injury to the bile duct.  Procedure and risks were reviewed with  the patient and informed consent was obtained.  PREOPERATIVE MEDICATIONS/MEDICATIONS:  The patient was given general anesthesia.  Please see anesthesia records for details.  FINDINGS:  Procedure was performed in the OR.  The patient was placed under anesthesia.  She was then turned into a semi-prone position.  Timeout was carried out.  Olympus video duodenoscope was passed via oropharynx into the esophagus and stomach across the pyloric channel into the bulb and postbulbar duodenum.  Multiple erosions were noted involving antral gastric mucosa as well as bulbar and postbulbar mucosa.   Ampulla of Vater was normal.  Cannulation was attempted with RX 44 autotome with 035 Hydra Jagwire.  CBD was easily cannulated.  It was filled with dilute contrast.  Intrahepatic biliary radicles and CHD were normal.  There was no evidence of stricture  or bile duct injury.  CBD in the distal segment was at the upper limit of normal.  A single stone was identified.  It was about 6 mm.  Biliary sphincterotomy was performed and stone was easily extracted with Dormia basket. Patient tolerated the procedure  well.  Complications: None  Bleeding: None  IMPRESSION: Erosive gastroduodenitis. Single stone and distal CBD. Biliary sphincterotomy performed and stone removed with Dormia basket.  RECOMMENDATIONS:   1.  Clear liquids today.   2.  No aspirin or anticoagulants for 72 hours.   3.  H. pylori serology. 4.  Pantoprazole 40 mg p.o. daily. 5.  CBC, LFTs and amylase to be done in a.m.  TN/NUANCE  D:02/13/2018 T:02/13/2018 JOB:[id number]

## 2018-02-14 NOTE — Progress Notes (Signed)
IV removed, 2x2 gauze and paper tape applied to site, patient tolerated well.  Reviewed AVS with patient who verbalized understanding.  Patient awaiting parents arrival to transport home.

## 2018-02-14 NOTE — Care Management (Signed)
Pt given list of PCP's accepting new pt's.

## 2018-02-14 NOTE — Discharge Summary (Signed)
Physician Discharge Summary  Patient ID: Michaela [name] MRN: [id number] DOB/AGE: 1990/06/16 26 y.o.  Admit date: 02/10/2018 Discharge date: 02/14/2018  Admission Diagnoses: Cholecystitis, cholelithiasis  Discharge Diagnoses: Same, choledocholithiasis Active Problems:   Cholecystitis, acute   Cholecystitis   Discharged Condition: good  Hospital Course: Patient is a 27 year old white female who presented to the emergency room with worsening right upper quadrant abdominal pain and nausea.  She was admitted by the hospitalist for further evaluation and treatment.  She was found to have acute cholecystitis with cholelithiasis.  She subsequently underwent a laparoscopic cholecystectomy on 02/12/2018.  Her postoperative course was remarkable for an elevation in her liver enzyme test.   Dr. Karilyn Cotaehman of gastroenterology was consulted and the patient underwent ERCP on 02/13/2018.  A distal common bile duct stone was found and was removed without difficulty.  She tolerated both procedures well.  Her diet was advanced without difficulty.  She is being discharged home on 02/14/2018 in good and improving condition.  Consults: GI and general surgery  Treatments: surgery: Laparoscopic cholecystectomy on 02/12/2018  ERCP with stone extraction on 02/13/2018  Discharge Exam: Blood pressure 108/63, pulse 79, temperature 99.8 F (37.7 C), temperature source Oral, resp. rate 18, height 4\' 9"  (1.448 m), weight 79.2 kg, SpO2 98 %. General appearance: alert, cooperative and no distress Resp: clear to auscultation bilaterally Cardio: regular rate and rhythm, S1, S2 normal, no murmur, click, rub or gallop GI: Soft, incisions healing well.  Disposition: Discharge disposition: 01-Home or Self Care       Discharge Instructions    Diet general   Complete by:  As directed    Increase activity slowly   Complete by:  As directed      Allergies as of 02/14/2018      Reactions   Pertussis Vaccines Hives   Headache   Latex Itching, Rash      Medication List    TAKE these medications   omeprazole 20 MG capsule Commonly known as:  PRILOSEC Take 1 capsule (20 mg total) by mouth daily.   oxyCODONE-acetaminophen 7.5-325 MG tablet Commonly known as:  PERCOCET Take 1 tablet by mouth every 4 (four) hours as needed.      Follow-up Information    Franky MachoJenkins, Khloei Spiker, MD. Schedule an appointment as soon as possible for a visit on 02/20/2018.   Specialty:  General Surgery Contact information: [address] [place] [id number] [phone]           Signed: Franky MachoMark Ziah Turvey 02/14/2018, 10:42 AM

## 2018-02-15 ENCOUNTER — Encounter (HOSPITAL_COMMUNITY): Payer: Self-pay | Admitting: Internal Medicine

## 2018-02-15 LAB — H. PYLORI ANTIBODY, IGG: H PYLORI IGG: 1.88 {index_val} — AB (ref 0.00–0.79)

## 2018-02-20 ENCOUNTER — Ambulatory Visit (INDEPENDENT_AMBULATORY_CARE_PROVIDER_SITE_OTHER): Payer: Self-pay | Admitting: General Surgery

## 2018-02-20 ENCOUNTER — Encounter: Payer: Self-pay | Admitting: General Surgery

## 2018-02-20 ENCOUNTER — Other Ambulatory Visit (INDEPENDENT_AMBULATORY_CARE_PROVIDER_SITE_OTHER): Payer: Self-pay | Admitting: Internal Medicine

## 2018-02-20 VITALS — BP 124/69 | HR 88 | Temp 97.7°F | Resp 18 | Wt 168.0 lb

## 2018-02-20 DIAGNOSIS — Z09 Encounter for follow-up examination after completed treatment for conditions other than malignant neoplasm: Secondary | ICD-10-CM

## 2018-02-20 MED ORDER — OXYCODONE-ACETAMINOPHEN 7.5-325 MG PO TABS: 1.0000 | ORAL_TABLET | ORAL | 0 refills | Status: AC | PRN

## 2018-02-20 MED ORDER — BIS SUBCIT-METRONID-TETRACYC 140-125-125 MG PO CAPS: 3.0000 | ORAL_CAPSULE | Freq: Three times a day (TID) | ORAL | 0 refills | Status: AC

## 2018-02-20 NOTE — Progress Notes (Signed)
Subjective:     Michaela [name]  Status post laparoscopic cholecystectomy and postoperative ERCP with stone extraction.  Patient doing well.  Has mild incisional pain.  She denies any fever, chills, jaundice.  She is much improved since her discharge. Objective:    BP 124/69 (BP Location: Left Arm, Patient Position: Sitting, Cuff Size: Normal)   Pulse 88   Temp 97.7 F (36.5 C) (Temporal)   Resp 18   Wt 168 lb (76.2 kg)   BMI 36.35 kg/m   General:  alert, cooperative and no distress  Eyes are without scleral icterus Abdomen soft, incisions healing well.  Staples removed, Steri-Strips applied. Final pathology consistent with diagnosis.     Assessment:    Doing well postoperatively.    Plan:   Patient still having some incisional pain and I have reordered her Percocet.  She realizes I cannot give her more pain medication after this.  The incisional pain should resolve with time.  She refuses lab draw for follow-up of her liver enzyme tests due to financial reasons.  May gradually return to normal activity.  Follow-up here as needed.

## 2018-02-21 ENCOUNTER — Other Ambulatory Visit (INDEPENDENT_AMBULATORY_CARE_PROVIDER_SITE_OTHER): Payer: Self-pay | Admitting: *Deleted

## 2018-02-21 ENCOUNTER — Encounter (INDEPENDENT_AMBULATORY_CARE_PROVIDER_SITE_OTHER): Payer: Self-pay | Admitting: *Deleted

## 2018-02-21 DIAGNOSIS — R7401 Elevation of levels of liver transaminase levels: Secondary | ICD-10-CM

## 2018-02-21 DIAGNOSIS — R740 Nonspecific elevation of levels of transaminase and lactic acid dehydrogenase [LDH]: Principal | ICD-10-CM

## 2019-02-22 ENCOUNTER — Emergency Department (HOSPITAL_COMMUNITY)
Admission: EM | Admit: 2019-02-22 | Discharge: 2019-02-22 | Disposition: A | Discharge status: Home / Self Care | Payer: Medicaid Other | Attending: Emergency Medicine | Admitting: Emergency Medicine

## 2019-02-22 ENCOUNTER — Encounter (HOSPITAL_COMMUNITY): Payer: Self-pay | Admitting: *Deleted

## 2019-02-22 ENCOUNTER — Emergency Department (HOSPITAL_COMMUNITY): Payer: Medicaid Other

## 2019-02-22 ENCOUNTER — Other Ambulatory Visit: Payer: Self-pay

## 2019-02-22 DIAGNOSIS — F17210 Nicotine dependence, cigarettes, uncomplicated: Secondary | ICD-10-CM | POA: Insufficient documentation

## 2019-02-22 DIAGNOSIS — R1013 Epigastric pain: Secondary | ICD-10-CM | POA: Diagnosis present

## 2019-02-22 DIAGNOSIS — R112 Nausea with vomiting, unspecified: Secondary | ICD-10-CM | POA: Diagnosis not present

## 2019-02-22 DIAGNOSIS — Z91040 Latex allergy status: Secondary | ICD-10-CM | POA: Insufficient documentation

## 2019-02-22 DIAGNOSIS — R339 Retention of urine, unspecified: Secondary | ICD-10-CM | POA: Diagnosis not present

## 2019-02-22 DIAGNOSIS — R1010 Upper abdominal pain, unspecified: Secondary | ICD-10-CM

## 2019-02-22 LAB — URINALYSIS, ROUTINE W REFLEX MICROSCOPIC
Bilirubin Urine: NEGATIVE
Glucose, UA: NEGATIVE mg/dL
Hgb urine dipstick: NEGATIVE
Ketones, ur: 20 mg/dL — AB
Leukocytes,Ua: NEGATIVE
Nitrite: NEGATIVE
Protein, ur: NEGATIVE mg/dL
Specific Gravity, Urine: 1.016 (ref 1.005–1.030)
pH: 6 (ref 5.0–8.0)

## 2019-02-22 LAB — HCG, SERUM, QUALITATIVE: Preg, Serum: NEGATIVE

## 2019-02-22 LAB — COMPREHENSIVE METABOLIC PANEL
ALT: 20 U/L (ref 0–44)
AST: 20 U/L (ref 15–41)
Albumin: 4.5 g/dL (ref 3.5–5.0)
Alkaline Phosphatase: 67 U/L (ref 38–126)
Anion gap: 8 (ref 5–15)
BUN: 8 mg/dL (ref 6–20)
CO2: 24 mmol/L (ref 22–32)
Calcium: 9.4 mg/dL (ref 8.9–10.3)
Chloride: 108 mmol/L (ref 98–111)
Creatinine, Ser: 0.63 mg/dL (ref 0.44–1.00)
GFR calc Af Amer: >60 mL/min (ref >60)
GFR calc non Af Amer: >60 mL/min (ref >60)
Glucose, Bld: 103 mg/dL — ABNORMAL HIGH (ref 70–99)
Potassium: 4.3 mmol/L (ref 3.5–5.1)
Sodium: 140 mmol/L (ref 135–145)
Total Bilirubin: 0.4 mg/dL (ref 0.3–1.2)
Total Protein: 7.9 g/dL (ref 6.5–8.1)

## 2019-02-22 LAB — CBC
HCT: 42.6 % (ref 36.0–46.0)
Hemoglobin: 13.4 g/dL (ref 12.0–15.0)
MCH: 30 pg (ref 26.0–34.0)
MCHC: 31.5 g/dL (ref 30.0–36.0)
MCV: 95.3 fL (ref 80.0–100.0)
Platelets: 229 10*3/uL (ref 150–400)
RBC: 4.47 MIL/uL (ref 3.87–5.11)
RDW: 13.2 % (ref 11.5–15.5)
WBC: 7.1 10*3/uL (ref 4.0–10.5)
nRBC: 0 % (ref 0.0–0.2)

## 2019-02-22 LAB — LIPASE, BLOOD: Lipase: 25 U/L (ref 11–51)

## 2019-02-22 MED ORDER — ONDANSETRON 4 MG PO TBDP
4.0000 mg | ORAL_TABLET | Freq: Once | ORAL | Status: AC | PRN
  Administered 2019-02-22: 4 mg via ORAL
  Filled 2019-02-22: qty 1

## 2019-02-22 MED ORDER — MORPHINE SULFATE (PF) 4 MG/ML IV SOLN
3.0000 mg | Freq: Once | INTRAVENOUS | Status: AC
  Administered 2019-02-22: 3 mg via INTRAVENOUS
  Filled 2019-02-22: qty 1

## 2019-02-22 MED ORDER — ONDANSETRON 4 MG PO TBDP: 4.0000 mg | ORAL_TABLET | Freq: Three times a day (TID) | ORAL | 0 refills | Status: DC | PRN

## 2019-02-22 MED ORDER — SODIUM CHLORIDE 0.9 % IV BOLUS
1000.0000 mL | Freq: Once | INTRAVENOUS | Status: AC
  Administered 2019-02-22: 16:00:00 1000 mL via INTRAVENOUS

## 2019-02-22 MED ORDER — ONDANSETRON 4 MG PO TBDP: 4.0000 mg | ORAL_TABLET | Freq: Three times a day (TID) | ORAL | 0 refills | Status: AC | PRN

## 2019-02-22 MED ORDER — IOHEXOL 300 MG/ML  SOLN
100.0000 mL | Freq: Once | INTRAMUSCULAR | Status: AC | PRN
  Administered 2019-02-22: 100 mL via INTRAVENOUS

## 2019-02-22 NOTE — ED Triage Notes (Signed)
Pt c/o vomiting, lightheadedness, abdominal pain x 2 days. Denies diarrhea.

## 2019-02-22 NOTE — ED Provider Notes (Signed)
[facility] EMERGENCY DEPARTMENT Provider Note   CSN: [id number] Arrival date & time: 02/22/19  1200     History   Chief Complaint Chief Complaint  Patient presents with  . Emesis    HPI Michaela [name] is a 28 y.o. female.     HPI   Michaela [name] is a 28 y.o. female who presents to the Emergency Department complaining of sudden onset of epigastric abdominal pain, vomiting and urinary retention.  Symptoms began 2 days ago.  She states that she was at work when she began vomiting.  She reports repeated episodes of vomiting and unable to keep down food or fluids since onset.  Yesterday she noticed that she had not urinated and states she does not have any sensation of needing to void.  She denies fever, chills, vaginal bleeding or discharge, flank pain.  No history of kidney stones.  History reviewed. No pertinent past medical history.  Patient Active Problem List   Diagnosis Date Noted  . Cholecystitis 02/12/2018  . Cholecystitis, acute 02/10/2018    Past Surgical History:  Procedure Laterality Date  . CESAREAN SECTION    . CHOLECYSTECTOMY N/A 02/12/2018   Procedure: LAPAROSCOPIC CHOLECYSTECTOMY;  Surgeon: Aviva Signs, MD;  Location: [facility] ORS;  Service: General;  Laterality: N/A;  . ERCP N/A 02/13/2018   Procedure: ENDOSCOPIC RETROGRADE CHOLANGIOPANCREATOGRAPHY (ERCP);  Surgeon: Rogene Houston, MD;  Location: [facility] ENDO SUITE;  Service: Gastroenterology;  Laterality: N/A;  . REMOVAL OF STONES N/A 02/13/2018   Procedure: REMOVAL OF STONES;  Surgeon: Rogene Houston, MD;  Location: [facility] ENDO SUITE;  Service: Gastroenterology;  Laterality: N/A;  . SPHINCTEROTOMY N/A 02/13/2018   Procedure: SPHINCTEROTOMY;  Surgeon: Rogene Houston, MD;  Location: [facility] END[facility] SUITE;  Service: Gastroenterology;  Laterality: N/A;  . TUBAL LIGATION       OB History    Gravida  2   Para  2   Term  2   Preterm      AB      Living  2     SAB      TAB      Ectopic      Multiple      Live Births               Home Medications    Prior to Admission medications   Medication Sig Start Date End Date Taking? Authorizing Provider  ibuprofen (ADVIL) 800 MG tablet Take 1 tablet by mouth every 8 (eight) hours as needed for pain. 07/26/18 07/26/19 Yes [provider]  bismuth-metronidazole-tetracycline (PYLERA) 140-125-125 MG capsule Take 3 capsules by mouth 4 (four) times daily -  before meals and at bedtime. 02/20/18   Rogene Houston, MD  omeprazole (PRILOSEC) 20 MG capsule Take 1 capsule (20 mg total) by mouth daily. 02/14/18   Aviva Signs, MD  oxyCODONE-acetaminophen (PERCOCET) 7.5-325 MG tablet Take 1 tablet by mouth every 4 (four) hours as needed. 02/20/18   Aviva Signs, MD    Family History No family history on file.  Social History Social History   Tobacco Use  . Smoking status: Current Every Day Smoker    Packs/day: 0.50    Years: 4.00    Pack years: 2.00    Types: Cigarettes  . Smokeless tobacco: Never Used  Substance Use Topics  . Alcohol use: No  . Drug use: No     Allergies   Pertussis vaccines and Latex   Review of Systems Review of Systems  Constitutional: Negative for appetite change, chills and fever.  Respiratory: Negative for shortness of breath.   Cardiovascular: Negative for chest pain.  Gastrointestinal: Positive for abdominal pain, nausea and vomiting. Negative for blood in stool and diarrhea.  Genitourinary: Positive for difficulty urinating. Negative for decreased urine volume, flank pain, vaginal bleeding and vaginal discharge.       Urinary retention  Musculoskeletal: Negative for back pain.  Skin: Negative for color change and rash.  Neurological: Negative for dizziness, weakness and numbness.  Hematological: Negative for adenopathy.     Physical Exam Updated Vital Signs BP (!) 107/58   Pulse 73   Temp 98.3 F (36.8 C) (Oral)   Resp 16   Ht 4\' 9"  (1.448 m)   Wt 74.8 kg   LMP 02/08/2019 Comment: pt has  had tubal ligation  SpO2 99%   BMI 35.71 kg/m   Physical Exam Constitutional:      Appearance: Normal appearance. She is not toxic-appearing.  HENT:     Head: Atraumatic.     Mouth/Throat:     Mouth: Mucous membranes are moist.  Neck:     Musculoskeletal: Normal range of motion.  Cardiovascular:     Rate and Rhythm: Normal rate and regular rhythm.     Pulses: Normal pulses.     Heart sounds: Normal heart sounds.  Pulmonary:     Effort: Pulmonary effort is normal.     Breath sounds: Normal breath sounds. No stridor.  Chest:     Chest wall: No tenderness.  Abdominal:     Palpations: Abdomen is soft.     Tenderness: There is abdominal tenderness.     Comments: Mild tenderness to palpation of the epigastric and right lower quadrant regions.  No guarding or rebound tenderness no peritoneal signs.  Musculoskeletal: Normal range of motion.  Skin:    General: Skin is warm.     Findings: No erythema or rash.  Neurological:     Mental Status: She is alert.      ED Treatments / Results  Labs (all labs ordered are listed, but only abnormal results are displayed) Labs Reviewed  COMPREHENSIVE METABOLIC PANEL - Abnormal; Notable for the following components:      Result Value   Glucose, Bld 103 (*)    All other components within normal limits  URINALYSIS, ROUTINE W REFLEX MICROSCOPIC - Abnormal; Notable for the following components:   Ketones, ur 20 (*)    All other components within normal limits  LIPASE, BLOOD  CBC  HCG, SERUM, QUALITATIVE    EKG None  Radiology No results found.  Procedures Procedures (including critical care time)  Medications Ordered in ED Medications  ondansetron (ZOFRAN-ODT) disintegrating tablet 4 mg (4 mg Oral Given 02/22/19 1229)  sodium chloride 0.9 % bolus 1,000 mL (1,000 mLs Intravenous New Bag/Given 02/22/19 1536)  morphine 4 MG/ML injection 3 mg (3 mg Intravenous Given 02/22/19 1536)     Initial Impression / Assessment and Plan / ED  Course  I have reviewed the triage vital signs and the nursing notes.  Pertinent labs & imaging results that were available during my care of the patient were reviewed by me and considered in my medical decision making (see chart for details).        Patient with reported epigastric abdominal pain vomiting.  She is well-appearing and nontoxic.  Mucous membranes are moist.  She endorses urinary retention and bladder scan revealed 450 cc of urine.  In and out cath obtained  for urinalysis.  No hx of kidney stones, flank pain or dysuria.  Laboratory studies are reassuring.  Since she has some RLQ tenderness on exam, will obtain labs and CT abd/pelvis  1730  CT scan still pending.  Pt resting comfortably, no vomiting during stay. Discussed pt with Burgess AmorJulie Idol, PA-C who assumes care.   Final Clinical Impressions(s) / ED Diagnoses   Final diagnoses:  None    ED Discharge Orders    None       Pauline Ausriplett, Fredi Hurtado, PA-C 02/22/19 1737    Blane OharaZavitz, Joshua, MD 02/25/19 [phone]

## 2019-02-22 NOTE — ED Notes (Signed)
Pt BP 97/62. Pt asymptomatic at this time. Melodye Ped, transport, permission to take pt to CT

## 2019-02-22 NOTE — Discharge Instructions (Addendum)
Your work up including your CT imaging and your blood tests today are reassuring.  Call Dr. Roseanne Kaufman office for follow up if your symptoms persist or do not resolve.  Use the zofran if needed for nausea relief and continue taking your omeprazole.  Maintain a bland diet for a few days (no spicy or greasy foods).

## 2019-02-22 NOTE — ED Provider Notes (Signed)
Pt signed out to me from [place], [place].  Pt with 2 day hx of epigastric and RUQ abd pain.    Vomiting, no fevers or chills.  Felt much improved after zofran given.   Discussed f/u with GI if sx return/persist.  Also given referral for pcp.     Labs and imaging reviewed and discussed with pt.  Results for orders placed or performed during the hospital encounter of 02/22/19  Lipase, blood  Result Value Ref Range   Lipase 25 11 - 51 U/L  Comprehensive metabolic panel  Result Value Ref Range   Sodium 140 135 - 145 mmol/L   Potassium 4.3 3.5 - 5.1 mmol/L   Chloride 108 98 - 111 mmol/L   CO2 24 22 - 32 mmol/L   Glucose, Bld 103 (H) 70 - 99 mg/dL   BUN 8 6 - 20 mg/dL   Creatinine, Ser 5.02 0.44 - 1.00 mg/dL   Calcium 9.4 8.9 - 77.4 mg/dL   Total Protein 7.9 6.5 - 8.1 g/dL   Albumin 4.5 3.5 - 5.0 g/dL   AST 20 15 - 41 U/L   ALT 20 0 - 44 U/L   Alkaline Phosphatase 67 38 - 126 U/L   Total Bilirubin 0.4 0.3 - 1.2 mg/dL   GFR calc non Af Amer >60 >60 mL/min   GFR calc Af Amer >60 >60 mL/min   Anion gap 8 5 - 15  CBC  Result Value Ref Range   WBC 7.1 4.0 - 10.5 K/uL   RBC 4.47 3.87 - 5.11 MIL/uL   Hemoglobin 13.4 12.0 - 15.0 g/dL   HCT 12.8 78.6 - 76.7 %   MCV 95.3 80.0 - 100.0 fL   MCH 30.0 26.0 - 34.0 pg   MCHC 31.5 30.0 - 36.0 g/dL   RDW 20.9 47.0 - 96.2 %   Platelets 229 150 - 400 K/uL   nRBC 0.0 0.0 - 0.2 %  Urinalysis, Routine w reflex microscopic  Result Value Ref Range   Color, Urine YELLOW YELLOW   APPearance CLEAR CLEAR   Specific Gravity, Urine 1.016 1.005 - 1.030   pH 6.0 5.0 - 8.0   Glucose, UA NEGATIVE NEGATIVE mg/dL   Hgb urine dipstick NEGATIVE NEGATIVE   Bilirubin Urine NEGATIVE NEGATIVE   Ketones, ur 20 (A) NEGATIVE mg/dL   Protein, ur NEGATIVE NEGATIVE mg/dL   Nitrite NEGATIVE NEGATIVE   Leukocytes,Ua NEGATIVE NEGATIVE  hCG, serum, qualitative  Result Value Ref Range   Preg, Serum NEGATIVE NEGATIVE   Ct Abdomen Pelvis W Contrast  Result Date:  02/22/2019 CLINICAL DATA:  Nausea, vomiting, epigastric abdominal pain. EXAM: CT ABDOMEN AND PELVIS WITH CONTRAST TECHNIQUE: Multidetector CT imaging of the abdomen and pelvis was performed using the standard protocol following bolus administration of intravenous contrast. CONTRAST:  OMNIPAQUE IOHEXOL 300 MG/ML  SOLN COMPARISON:  02/10/2018 FINDINGS: Lower chest: Lung bases are clear. No effusions. Heart is normal size. Hepatobiliary: No focal liver abnormality is seen. Status post cholecystectomy. No biliary dilatation. Pancreas: No focal abnormality or ductal dilatation. Spleen: No focal abnormality.  Normal size. Adrenals/Urinary Tract: No adrenal abnormality. No focal renal abnormality. No stones or hydronephrosis. Urinary bladder is unremarkable. Stomach/Bowel: Normal appendix. Stomach, large and small bowel grossly unremarkable. Vascular/Lymphatic: No evidence of aneurysm or adenopathy. Reproductive: Uterus and adnexa unremarkable. No mass. Bilateral tubal ligation clips noted. Other: Small amount of free fluid in the pelvis, likely physiologic. No free air. Musculoskeletal: No acute bony abnormality. IMPRESSION: No acute findings  in the abdomen or pelvis. Electronically Signed   By: Rolm Baptise M.D.   On: 02/22/2019 18:35      Landis Martins 02/22/19 2009    Milton Ferguson, MD 02/27/19 1027

## 2019-12-20 ENCOUNTER — Emergency Department (HOSPITAL_COMMUNITY)
Admission: EM | Admit: 2019-12-20 | Discharge: 2019-12-20 | Disposition: A | Discharge status: Home / Self Care | Payer: Medicaid Other | Attending: Emergency Medicine | Admitting: Emergency Medicine

## 2019-12-20 ENCOUNTER — Other Ambulatory Visit: Payer: Self-pay

## 2019-12-20 ENCOUNTER — Encounter (HOSPITAL_COMMUNITY): Payer: Self-pay

## 2019-12-20 DIAGNOSIS — K08409 Partial loss of teeth, unspecified cause, unspecified class: Secondary | ICD-10-CM | POA: Insufficient documentation

## 2019-12-20 DIAGNOSIS — Z5321 Procedure and treatment not carried out due to patient leaving prior to being seen by health care provider: Secondary | ICD-10-CM | POA: Diagnosis not present

## 2019-12-20 NOTE — ED Triage Notes (Signed)
Pt to er, pt states that Monday she had three teeth extracted, states that she woke up this am with some bleeding, states that she got it to stop with some gauze, states that today at work she started to have some more bleeding.  States that she called the dentist and was told to come to the er.

## 2019-12-20 NOTE — ED Triage Notes (Signed)
Bleeding is stopped at this time

## 2021-09-08 ENCOUNTER — Emergency Department (HOSPITAL_COMMUNITY)
Admission: EM | Admit: 2021-09-08 | Discharge: 2021-09-09 | Disposition: A | Discharge status: Home / Self Care | Payer: Medicaid Other | Attending: Emergency Medicine | Admitting: Emergency Medicine

## 2021-09-08 ENCOUNTER — Other Ambulatory Visit: Payer: Self-pay

## 2021-09-08 ENCOUNTER — Encounter (HOSPITAL_COMMUNITY): Payer: Self-pay | Admitting: Emergency Medicine

## 2021-09-08 DIAGNOSIS — R519 Headache, unspecified: Secondary | ICD-10-CM | POA: Diagnosis present

## 2021-09-08 DIAGNOSIS — D72829 Elevated white blood cell count, unspecified: Secondary | ICD-10-CM | POA: Diagnosis not present

## 2021-09-08 DIAGNOSIS — Z91040 Latex allergy status: Secondary | ICD-10-CM | POA: Diagnosis not present

## 2021-09-08 NOTE — ED Triage Notes (Signed)
Pt c/o headache x one month, but states tonight right eye/mouth looks droopy since about 2130. Pt states she felt like she was having panic attacks tonight.  ?

## 2021-09-09 ENCOUNTER — Emergency Department (HOSPITAL_COMMUNITY): Payer: Medicaid Other

## 2021-09-09 LAB — CBC WITH DIFFERENTIAL/PLATELET
Abs Immature Granulocytes: 0.13 10*3/uL — ABNORMAL HIGH (ref 0.00–0.07)
Basophils Absolute: 0.1 10*3/uL (ref 0.0–0.1)
Basophils Relative: 1 %
Eosinophils Absolute: 0.3 10*3/uL (ref 0.0–0.5)
Eosinophils Relative: 3 %
HCT: 45.6 % (ref 36.0–46.0)
Hemoglobin: 15 g/dL (ref 12.0–15.0)
Immature Granulocytes: 1 %
Lymphocytes Relative: 31 %
Lymphs Abs: 3.3 10*3/uL (ref 0.7–4.0)
MCH: 31 pg (ref 26.0–34.0)
MCHC: 32.9 g/dL (ref 30.0–36.0)
MCV: 94.2 fL (ref 80.0–100.0)
Monocytes Absolute: 0.6 10*3/uL (ref 0.1–1.0)
Monocytes Relative: 6 %
Neutro Abs: 6.2 10*3/uL (ref 1.7–7.7)
Neutrophils Relative %: 58 %
Platelets: 250 10*3/uL (ref 150–400)
RBC: 4.84 MIL/uL (ref 3.87–5.11)
RDW: 13 % (ref 11.5–15.5)
WBC: 10.6 10*3/uL — ABNORMAL HIGH (ref 4.0–10.5)
nRBC: 0 % (ref 0.0–0.2)

## 2021-09-09 LAB — COMPREHENSIVE METABOLIC PANEL
ALT: 34 U/L (ref 0–44)
AST: 23 U/L (ref 15–41)
Albumin: 4.8 g/dL (ref 3.5–5.0)
Alkaline Phosphatase: 69 U/L (ref 38–126)
Anion gap: 9 (ref 5–15)
BUN: 12 mg/dL (ref 6–20)
CO2: 26 mmol/L (ref 22–32)
Calcium: 9.3 mg/dL (ref 8.9–10.3)
Chloride: 106 mmol/L (ref 98–111)
Creatinine, Ser: 0.8 mg/dL (ref 0.44–1.00)
GFR, Estimated: >60 mL/min (ref >60)
Glucose, Bld: 92 mg/dL (ref 70–99)
Potassium: 3.3 mmol/L — ABNORMAL LOW (ref 3.5–5.1)
Sodium: 141 mmol/L (ref 135–145)
Total Bilirubin: 0.4 mg/dL (ref 0.3–1.2)
Total Protein: 8.2 g/dL — ABNORMAL HIGH (ref 6.5–8.1)

## 2021-09-09 LAB — I-STAT BETA HCG BLOOD, ED (MC, WL, AP ONLY): I-stat hCG, quantitative: <5 m[IU]/mL (ref <5)

## 2021-09-09 MED ORDER — LACTATED RINGERS IV BOLUS
1000.0000 mL | Freq: Once | INTRAVENOUS | Status: AC
  Administered 2021-09-09: 1000 mL via INTRAVENOUS

## 2021-09-09 MED ORDER — PROCHLORPERAZINE EDISYLATE 10 MG/2ML IJ SOLN
10.0000 mg | Freq: Once | INTRAMUSCULAR | Status: AC
  Administered 2021-09-09: 10 mg via INTRAVENOUS
  Filled 2021-09-09: qty 2

## 2021-09-09 NOTE — ED Notes (Signed)
Patient transported to CT 

## 2021-09-09 NOTE — ED Notes (Signed)
Pt asked to leave AMA due to personal reasons ?

## 2021-09-09 NOTE — ED Provider Notes (Signed)
?[facility] EMERGENCY DEPARTMENT ?Provider Note ? ? ?CSN: [id number] ?Arrival date & time: 09/08/21  2339 ? ?  ? ?History ? ?Chief Complaint  ?Patient presents with  ? Headache  ? ? ?Michaela [name] is a 31 y.o. female. ? ?The history is provided by the patient.  ?Headache ?She complains of a left sided headache which has been present for the last month and getting worse.  Pain is constant.  She did go to an urgent care center who treated her with a migraine cocktail without any improvement.  She has taken a variety of over-the-counter medications without any improvement.  Tonight, pain got worse and she felt like she was having an anxiety attack with generalized jitteriness and noted that she was having some drooping on the right side of her face.  The drooping started at about 9:30 PM.  She did not notice any other weakness. ?  ?Home Medications ?Prior to Admission medications   ?Medication Sig Start Date End Date Taking? Authorizing Provider  ?bismuth-metronidazole-tetracycline (PYLERA) 140-125-125 MG capsule Take 3 capsules by mouth 4 (four) times daily -  before meals and at bedtime. 02/20/18   Malissa Hippo, MD  ?omeprazole (PRILOSEC) 20 MG capsule Take 1 capsule (20 mg total) by mouth daily. 02/14/18   Franky Macho, MD  ?ondansetron (ZOFRAN ODT) 4 MG disintegrating tablet Take 1 tablet (4 mg total) by mouth every 8 (eight) hours as needed for nausea or vomiting. 02/22/19   Burgess Amor, PA-C  ?oxyCODONE-acetaminophen (PERCOCET) 7.5-325 MG tablet Take 1 tablet by mouth every 4 (four) hours as needed. 02/20/18   Franky Macho, MD  ?   ? ?Allergies    ?Pertussis vaccines and Latex   ? ?Review of Systems   ?Review of Systems  ?Neurological:  Positive for headaches.  ?All other systems reviewed and are negative. ? ?Physical Exam ?Updated Vital Signs ?BP 129/87   Pulse (!) 105   Temp 98.3 ?F (36.8 ?C) (Oral)   Resp 18   Ht 4\' 10"  (1.473 m)   Wt 88.5 kg   LMP  (LMP Unknown)   SpO2 100%   BMI 40.76 kg/m?   ?Physical Exam ?Vitals and nursing note reviewed.  ?31 year old female, resting comfortably and in no acute distress. Vital signs are significant for borderline elevated heart rate. Oxygen saturation is 100%, which is normal. ?Head is normocephalic and atraumatic. PERRLA, EOMI. Oropharynx is clear. ?Neck is nontender and supple without adenopathy or JVD. ?Back is nontender and there is no CVA tenderness. ?Lungs are clear without rales, wheezes, or rhonchi. ?Chest is nontender. ?Heart has regular rate and rhythm without murmur. ?Abdomen is soft, flat, nontender without masses or hepatosplenomegaly and peristalsis is normoactive. ?Extremities have no cyanosis or edema, full range of motion is present. ?Skin is warm and dry without rash. ?Neurologic: Mental status is normal, questionable right central facial droop present.  Remainder of cranial nerves are intact.  Strength is 5/5 in all 4 extremities, there is no pronator drift.  Sensation is intact in all 4 extremities, there is no extinction on double simultaneous stimulation.  Finger-to-nose testing is normal.  Visual fields are intact to confrontation. ? ?ED Results / Procedures / Treatments   ?Labs ?(all labs ordered are listed, but only abnormal results are displayed) ?Labs Reviewed  ?CBC WITH DIFFERENTIAL/PLATELET - Abnormal; Notable for the following components:  ?    Result Value  ? WBC 10.6 (*)   ? Abs Immature Granulocytes 0.13 (*)   ?  All other components within normal limits  ?COMPREHENSIVE METABOLIC PANEL  ?I-STAT BETA HCG BLOOD, ED ([facility], [facility], [facility] ONLY)  ? ?Radiology ?CT Head Wo Contrast ? ?Result Date: 09/09/2021 ?CLINICAL DATA:  Chronic headaches for 1 month EXAM: CT HEAD WITHOUT CONTRAST TECHNIQUE: Contiguous axial images were obtained from the base of the skull through the vertex without intravenous contrast. RADIATION DOSE REDUCTION: This exam was performed according to the departmental dose-optimization program which includes automated exposure  control, adjustment of the mA and/or kV according to patient size and/or use of iterative reconstruction technique. COMPARISON:  None. FINDINGS: Brain: No evidence of acute infarction, hemorrhage, hydrocephalus, extra-axial collection or mass lesion/mass effect. Vascular: No hyperdense vessel or unexpected calcification. Skull: Normal. Negative for fracture or focal lesion. Sinuses/Orbits: No acute finding. Other: None. IMPRESSION: No acute intracranial abnormality noted. Electronically Signed   By: Alcide Clever M.D.   On: 09/09/2021 00:41   ? ?Procedures ?Procedures  ? ? ?Medications Ordered in ED ?Medications  ?lactated ringers bolus 1,000 mL (has no administration in time range)  ?prochlorperazine (COMPAZINE) injection 10 mg (has no administration in time range)  ? ? ?ED Course/ Medical Decision Making/ A&P ?  ?                        ?Medical Decision Making ?Amount and/or Complexity of Data Reviewed ?Labs: ordered. ?Radiology: ordered. ? ?Risk ?Prescription drug management. ? ? ?New left-sided headache with questionable right sided facial droop.  Old records reviewed showing urgent care visit on 4/6 where she got migraine cocktail of ketorolac, promethazine, sumatriptan, diphenhydramine and dexamethasone.  I see no other visits for headaches, no prior CNS imaging.  She does not meet criteria for code stroke activation, will be sent for CT of head as well as obtain screening labs.  Although unlikely to be of help, will try another migraine cocktail of normal saline and prochlorperazine.  Differential diagnosis includes, but is not limited to, muscle contraction headache, migraine headache, CNS tumor. ? ?CT of head shows no evidence of tumor or other acute process.  I have independently viewed the images, and agree with the radiologist's interpretation.  Labs show borderline leukocytosis with normal differential.  This is nonspecific.  Patient left AGAINST MEDICAL ADVICE telling RN that she had to leave for  personal reasons.  I was not able to speak with her before she left.  At this point, next step would be MRI of brain and neurology referral. ? ?Final Clinical Impression(s) / ED Diagnoses ?Final diagnoses:  ?Bad headache  ? ? ?Rx / DC Orders ?ED Discharge Orders   ? ? None  ? ?  ? ? ?  ?Dione Booze, MD ?09/09/21 0124 ? ?

## 2022-05-24 IMAGING — CT CT HEAD W/O CM
4 series · 16 of 47 positions shown, 18 images · non-contrast
Comparison: None.

CLINICAL DATA: Chronic headaches for 1 month



[Series 2: head w o · axial · 0.40mm/px · z∈[+91,+201]mm · 7 of 30 slices shown, 9 images]
[im 4/30  brain]
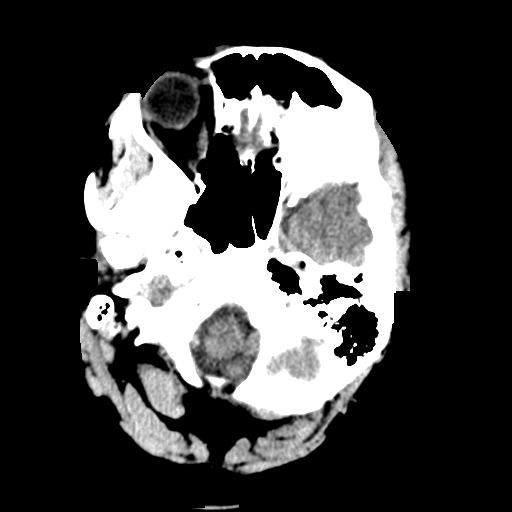
[im 4/30  bone]
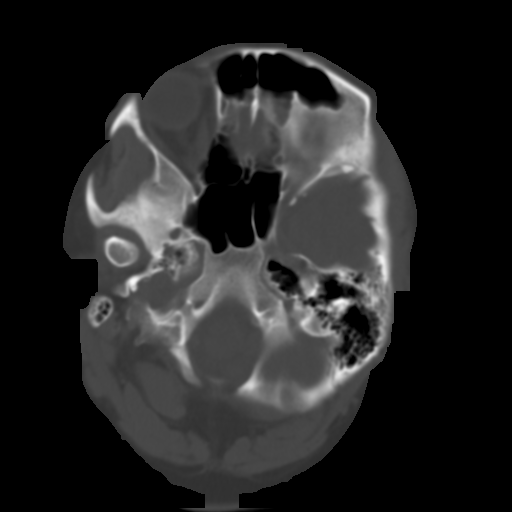
[im 8/30  brain]
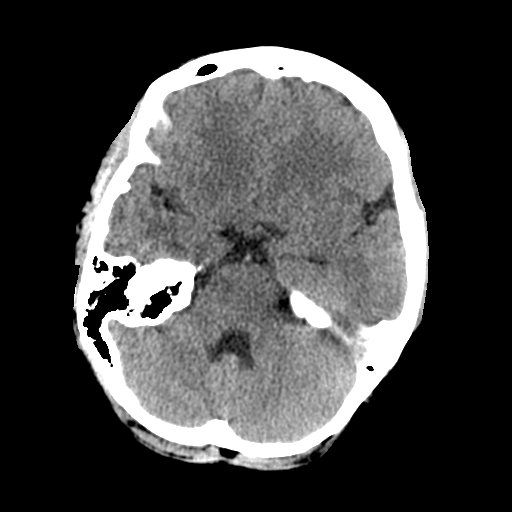
[im 11/30  brain]
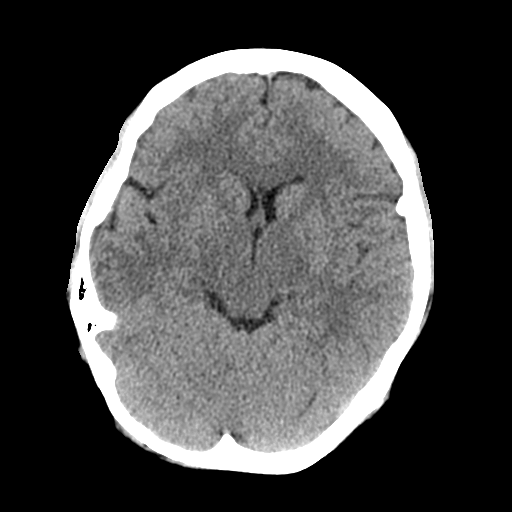
[im 15/30  brain]
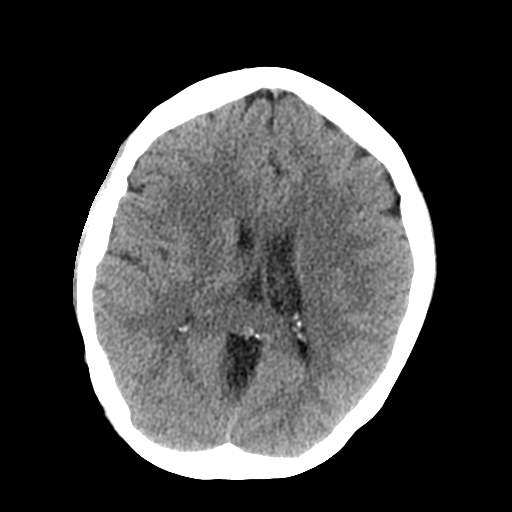
[im 19/30  brain]
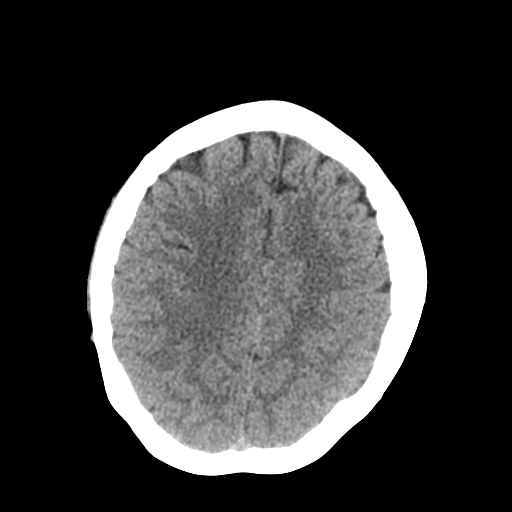
[im 19/30  bone]
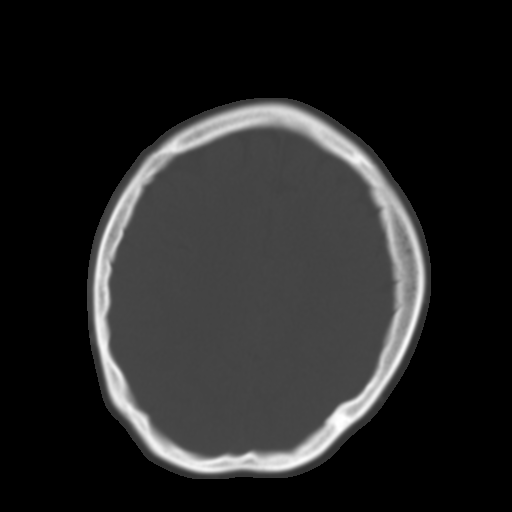
[im 22/30  brain]
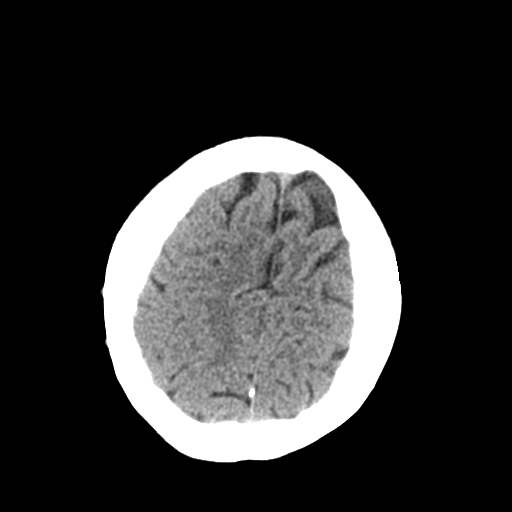
[im 26/30  brain]
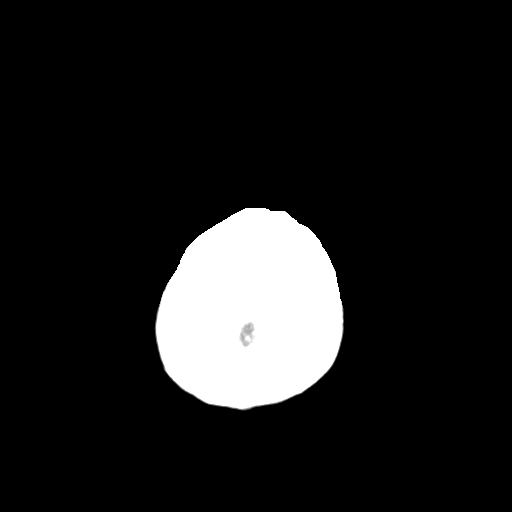

[Series 3: head bone · axial · 0.40mm/px · z∈[+90,+118]mm · 3 of 74 slices shown]
[im 8/74  bone]
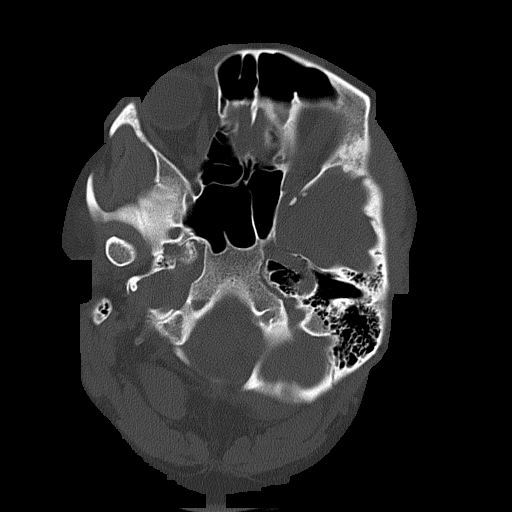
[im 15/74  bone]
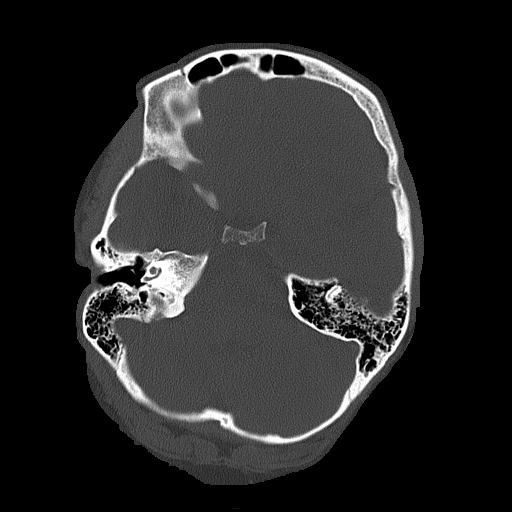
[im 22/74  bone]
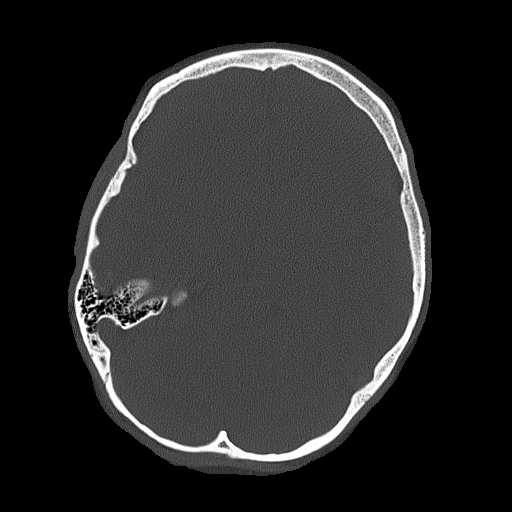

[Series 4: coronal soft · coronal · 0.30mm/px · 3 of 64 slices shown]
[im 22/64  brain]
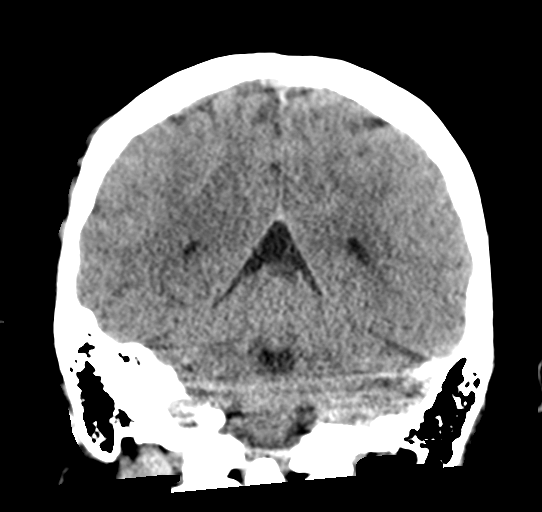
[im 29/64  brain]
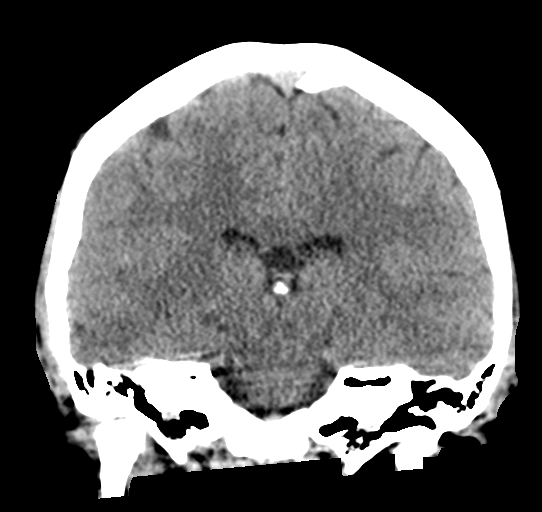
[im 36/64  brain]
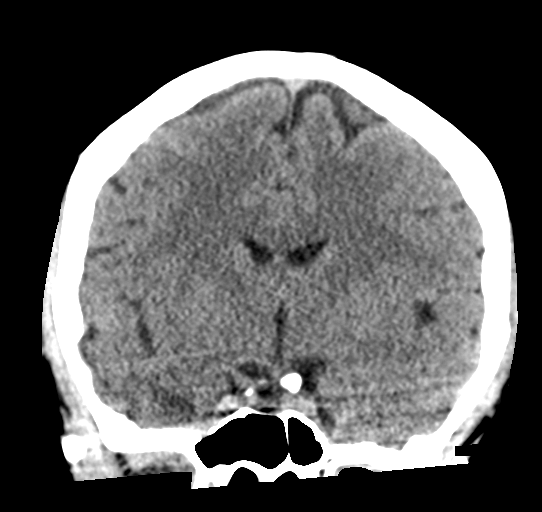

[Series 5: sagittal soft · sagittal · 0.28mm/px · 3 of 54 slices shown]
[im 18/54  brain]
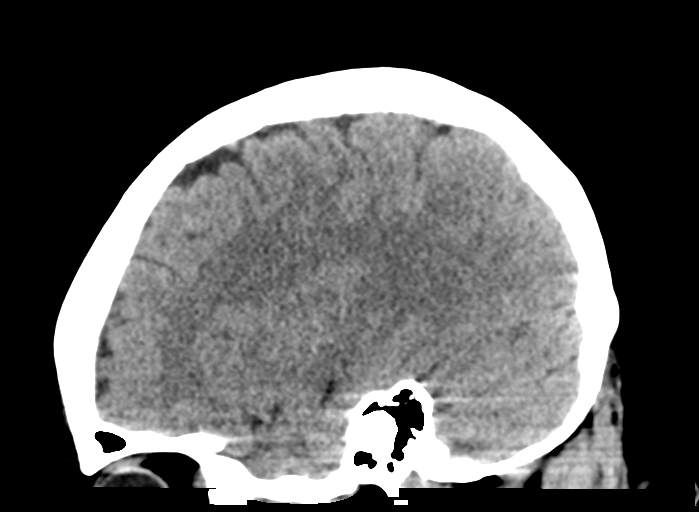
[im 27/54  brain]
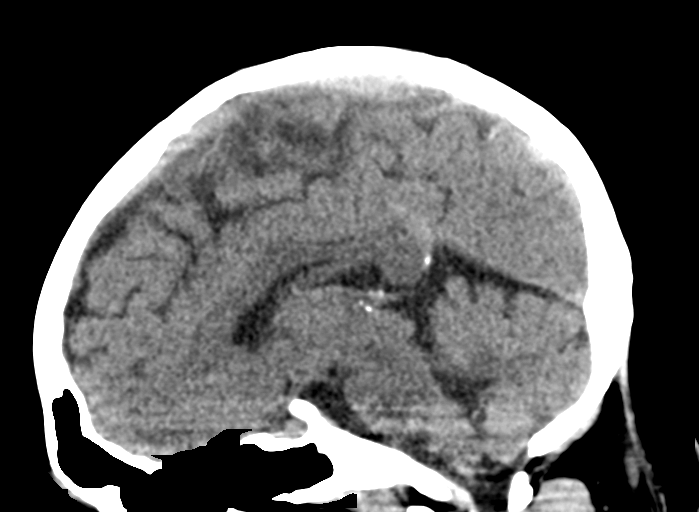
[im 36/54  brain]
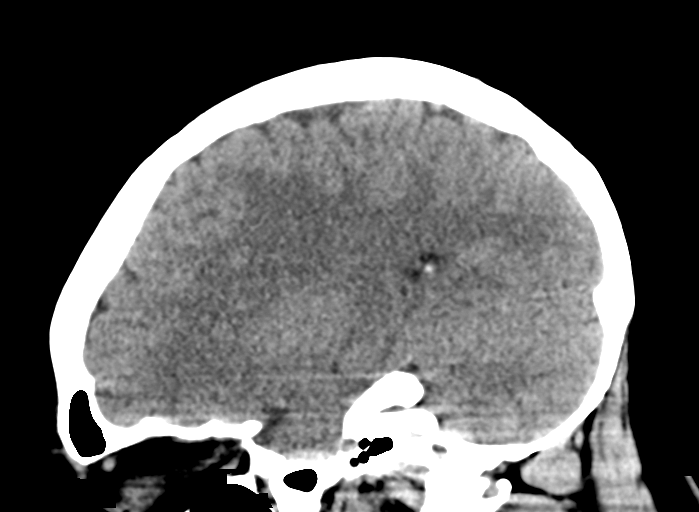

[16 of 47 positions shown; findings below may reference images not displayed]

FINDINGS: Brain: No evidence of acute infarction, hemorrhage, hydrocephalus,
extra-axial collection or mass lesion/mass effect.

Vascular: No hyperdense vessel or unexpected calcification.

Skull: Normal. Negative for fracture or focal lesion.

Sinuses/Orbits: No acute finding.

Other: None.
IMPRESSION: No acute intracranial abnormality noted.
